# Patient Record
Sex: Male | Born: 1965 | Race: White | Hispanic: No | Marital: Married | Smoking: Current every day smoker
Health system: Southern US, Community
[De-identification: ages and names within clinical notes are randomized; demographics above are authoritative.]

## PROBLEM LIST (undated history)

## (undated) DIAGNOSIS — E669 Obesity, unspecified: Secondary | ICD-10-CM

## (undated) DIAGNOSIS — M549 Dorsalgia, unspecified: Secondary | ICD-10-CM

## (undated) DIAGNOSIS — R7301 Impaired fasting glucose: Secondary | ICD-10-CM

## (undated) DIAGNOSIS — F329 Major depressive disorder, single episode, unspecified: Secondary | ICD-10-CM

## (undated) DIAGNOSIS — E785 Hyperlipidemia, unspecified: Secondary | ICD-10-CM

## (undated) DIAGNOSIS — I10 Essential (primary) hypertension: Secondary | ICD-10-CM

## (undated) DIAGNOSIS — G8929 Other chronic pain: Secondary | ICD-10-CM

## (undated) DIAGNOSIS — F32A Depression, unspecified: Secondary | ICD-10-CM

## (undated) DIAGNOSIS — F17200 Nicotine dependence, unspecified, uncomplicated: Secondary | ICD-10-CM

## (undated) DIAGNOSIS — K219 Gastro-esophageal reflux disease without esophagitis: Secondary | ICD-10-CM

## (undated) HISTORY — DX: Impaired fasting glucose: R73.01

## (undated) HISTORY — DX: Depression, unspecified: F32.A

## (undated) HISTORY — DX: Essential (primary) hypertension: I10

## (undated) HISTORY — DX: Obesity, unspecified: E66.9

## (undated) HISTORY — DX: Hyperlipidemia, unspecified: E78.5

## (undated) HISTORY — DX: Nicotine dependence, unspecified, uncomplicated: F17.200

## (undated) HISTORY — DX: Major depressive disorder, single episode, unspecified: F32.9

## (undated) HISTORY — DX: Gastro-esophageal reflux disease without esophagitis: K21.9

## (undated) HISTORY — DX: Dorsalgia, unspecified: M54.9

## (undated) HISTORY — DX: Other chronic pain: G89.29

---

## 2008-09-30 ENCOUNTER — Ambulatory Visit: Payer: Self-pay | Admitting: Family Medicine

## 2008-09-30 DIAGNOSIS — F329 Major depressive disorder, single episode, unspecified: Secondary | ICD-10-CM

## 2008-10-02 ENCOUNTER — Telehealth: Payer: Self-pay | Admitting: Family Medicine

## 2008-11-07 ENCOUNTER — Encounter: Admission: RE | Admit: 2008-11-07 | Discharge: 2008-11-07 | Payer: Self-pay | Admitting: Family Medicine

## 2008-11-07 ENCOUNTER — Ambulatory Visit: Payer: Self-pay | Admitting: Family Medicine

## 2008-11-07 DIAGNOSIS — M545 Low back pain: Secondary | ICD-10-CM

## 2008-11-07 LAB — CONVERTED CEMR LAB
Blood in Urine, dipstick: NEGATIVE
Glucose, Urine, Semiquant: NEGATIVE
Ketones, urine, test strip: NEGATIVE
WBC Urine, dipstick: NEGATIVE

## 2008-11-11 ENCOUNTER — Encounter: Payer: Self-pay | Admitting: Family Medicine

## 2008-11-11 LAB — CONVERTED CEMR LAB
BUN: 14 mg/dL (ref 6–23)
Basophils Relative: 0 % (ref 0–1)
CO2: 23 meq/L (ref 19–32)
Calcium: 9.8 mg/dL (ref 8.4–10.5)
Chloride: 101 meq/L (ref 96–112)
Cholesterol: 306 mg/dL — ABNORMAL HIGH (ref 0–200)
Creatinine, Ser: 1.08 mg/dL (ref 0.40–1.50)
Eosinophils Absolute: 0.1 10*3/uL (ref 0.0–0.7)
Eosinophils Relative: 1 % (ref 0–5)
Glucose, Bld: 113 mg/dL — ABNORMAL HIGH (ref 70–99)
HCT: 48.1 % (ref 39.0–52.0)
HDL: 40 mg/dL (ref >39)
Lymphs Abs: 1.7 10*3/uL (ref 0.7–4.0)
MCHC: 32 g/dL (ref 30.0–36.0)
MCV: 85.1 fL (ref 78.0–100.0)
Monocytes Relative: 7 % (ref 3–12)
Neutrophils Relative %: 75 % (ref 43–77)
RBC: 5.65 M/uL (ref 4.22–5.81)
Total Bilirubin: 0.4 mg/dL (ref 0.3–1.2)
Total CHOL/HDL Ratio: 7.7
Triglycerides: 454 mg/dL — ABNORMAL HIGH (ref <150)
WBC: 10.8 10*3/uL — ABNORMAL HIGH (ref 4.0–10.5)

## 2008-11-15 ENCOUNTER — Telehealth: Payer: Self-pay | Admitting: Family Medicine

## 2009-02-18 ENCOUNTER — Ambulatory Visit: Payer: Self-pay | Admitting: Family Medicine

## 2009-02-18 DIAGNOSIS — F17200 Nicotine dependence, unspecified, uncomplicated: Secondary | ICD-10-CM

## 2009-02-18 DIAGNOSIS — I10 Essential (primary) hypertension: Secondary | ICD-10-CM

## 2009-02-18 DIAGNOSIS — E785 Hyperlipidemia, unspecified: Secondary | ICD-10-CM

## 2009-02-18 DIAGNOSIS — R7301 Impaired fasting glucose: Secondary | ICD-10-CM

## 2009-02-18 DIAGNOSIS — E669 Obesity, unspecified: Secondary | ICD-10-CM

## 2009-02-25 ENCOUNTER — Telehealth: Payer: Self-pay | Admitting: Family Medicine

## 2009-04-16 ENCOUNTER — Ambulatory Visit: Payer: Self-pay | Admitting: Family Medicine

## 2009-04-16 LAB — CONVERTED CEMR LAB: Blood Glucose, AC Bkfst: 104 mg/dL

## 2009-04-17 ENCOUNTER — Encounter: Payer: Self-pay | Admitting: Family Medicine

## 2009-04-17 LAB — CONVERTED CEMR LAB
HDL: 46 mg/dL (ref >39)
Total CHOL/HDL Ratio: 5.3
Triglycerides: 457 mg/dL — ABNORMAL HIGH (ref <150)

## 2009-05-30 ENCOUNTER — Ambulatory Visit: Payer: Self-pay | Admitting: Family Medicine

## 2009-05-30 DIAGNOSIS — R748 Abnormal levels of other serum enzymes: Secondary | ICD-10-CM | POA: Insufficient documentation

## 2009-05-30 LAB — CONVERTED CEMR LAB: Blood Glucose, AC Bkfst: 104 mg/dL

## 2009-06-06 LAB — CONVERTED CEMR LAB
CO2: 23 meq/L (ref 19–32)
Chloride: 101 meq/L (ref 96–112)
Creatinine, Ser: 0.85 mg/dL (ref 0.40–1.50)
Direct LDL: 152 mg/dL — ABNORMAL HIGH
Glucose, Bld: 101 mg/dL — ABNORMAL HIGH (ref 70–99)
Hep B Core Total Ab: NEGATIVE

## 2009-09-19 ENCOUNTER — Ambulatory Visit: Payer: Self-pay | Admitting: Family Medicine

## 2009-10-03 ENCOUNTER — Ambulatory Visit: Payer: Self-pay | Admitting: Family Medicine

## 2009-10-03 LAB — CONVERTED CEMR LAB: Hgb A1c MFr Bld: 6.1 %

## 2009-12-05 ENCOUNTER — Ambulatory Visit: Payer: Self-pay | Admitting: Family Medicine

## 2009-12-08 LAB — CONVERTED CEMR LAB
ALT: 50 units/L (ref 0–53)
AST: 30 units/L (ref 0–37)
Calcium: 9.6 mg/dL (ref 8.4–10.5)
Chloride: 97 meq/L (ref 96–112)
Creatinine, Ser: 0.93 mg/dL (ref 0.40–1.50)
Sodium: 135 meq/L (ref 135–145)
Total Bilirubin: 0.4 mg/dL (ref 0.3–1.2)

## 2010-02-18 ENCOUNTER — Emergency Department (HOSPITAL_COMMUNITY): Admission: EM | Admit: 2010-02-18 | Discharge: 2010-02-18 | Payer: Self-pay | Admitting: Emergency Medicine

## 2010-02-25 ENCOUNTER — Ambulatory Visit: Payer: Self-pay | Admitting: Family

## 2010-02-25 DIAGNOSIS — R059 Cough, unspecified: Secondary | ICD-10-CM | POA: Insufficient documentation

## 2010-02-25 DIAGNOSIS — R05 Cough: Secondary | ICD-10-CM | POA: Insufficient documentation

## 2010-03-06 ENCOUNTER — Ambulatory Visit: Payer: Self-pay | Admitting: Family Medicine

## 2010-03-18 ENCOUNTER — Telehealth: Payer: Self-pay | Admitting: Pulmonary Disease

## 2010-04-22 ENCOUNTER — Emergency Department (HOSPITAL_BASED_OUTPATIENT_CLINIC_OR_DEPARTMENT_OTHER): Admission: EM | Admit: 2010-04-22 | Discharge: 2010-04-22 | Payer: Self-pay | Admitting: Emergency Medicine

## 2010-04-28 ENCOUNTER — Emergency Department (HOSPITAL_BASED_OUTPATIENT_CLINIC_OR_DEPARTMENT_OTHER): Admission: EM | Admit: 2010-04-28 | Discharge: 2010-04-28 | Payer: Self-pay | Admitting: Emergency Medicine

## 2010-05-08 ENCOUNTER — Ambulatory Visit: Payer: Self-pay | Admitting: Family Medicine

## 2010-05-08 DIAGNOSIS — J301 Allergic rhinitis due to pollen: Secondary | ICD-10-CM

## 2010-07-28 ENCOUNTER — Emergency Department (HOSPITAL_BASED_OUTPATIENT_CLINIC_OR_DEPARTMENT_OTHER)
Admission: EM | Admit: 2010-07-28 | Discharge: 2010-07-28 | Payer: Self-pay | Source: Home / Self Care | Admitting: Emergency Medicine

## 2010-07-31 ENCOUNTER — Ambulatory Visit: Payer: Self-pay | Admitting: Family Medicine

## 2010-07-31 DIAGNOSIS — J209 Acute bronchitis, unspecified: Secondary | ICD-10-CM | POA: Insufficient documentation

## 2010-09-13 ENCOUNTER — Encounter: Payer: Self-pay | Admitting: Family Medicine

## 2010-09-18 ENCOUNTER — Ambulatory Visit
Admission: RE | Admit: 2010-09-18 | Discharge: 2010-09-18 | Payer: Self-pay | Source: Home / Self Care | Attending: Family Medicine | Admitting: Family Medicine

## 2010-09-22 NOTE — Assessment & Plan Note (Signed)
Summary: back pain/ IFG/ HTN   Vital Signs:  Patient profile:   45 year old male Height:      63.5 inches Weight:      230 pounds BMI:     40.25 O2 Sat:      97 % on Room air Temp:     97.7 degrees F oral Pulse rate:   107 / minute BP sitting:   152 / 96  (right arm) Cuff size:   large  Vitals Entered By: [name] CMA/April (October 03, 2009 8:27 AM)  O2 Flow:  Room air CC: 4 month f/u and meds refill   Primary Care Provider:  Seymour Bars DO  CC:  4 month f/u and meds refill.  History of Present Illness: 45 yo WM presents for f/u IFG, HTN and back pain.  His back pain did improve after treatment but started again.  He turned the wrong way and his pain is in the upper lumbar region on both sides.  He is having trouble getting comfortable at night.  He has not been doing any stretching and he has yet to make any changes to his diet or exercise.  He is doing well on his meds.  Trying to be more compliant.  Denies CP or DOE.    Current Medications (verified): 1)  Fluoxetine Hcl 20 Mg Caps (Fluoxetine Hcl) .Marland Kitchen.. 1 Capsule By Mouth Daily 2)  Pravastatin Sodium 80 Mg Tabs (Pravastatin Sodium) .Marland Kitchen.. 1 Tab By Mouth Qhs 3)  Enalapril Maleate 10 Mg Tabs (Enalapril Maleate) .Marland Kitchen.. 1 Tab By Mouth Daily 4)  Metformin Hcl 500 Mg Tabs (Metformin Hcl) .Marland Kitchen.. 1 Tab By Mouth Bid 5)  Flexeril 10 Mg Tabs (Cyclobenzaprine Hcl) .Marland Kitchen.. 1 Tab By Mouth At Bedtime As Needed Back Pain 6)  Vicodin 5-500 Mg Tabs (Hydrocodone-Acetaminophen) .Marland Kitchen.. 1 Tab By Mouth Three Times A Day As Needed Severe Back Pain  Allergies (verified): No Known Drug Allergies  Past History:  Past Medical History: Reviewed history from 09/19/2009 and no changes required. Depression/ anxiety IFG/ borderline T2DM Hyperlipidemia Class III obesity HTN Smoker  Social History: Reviewed history from 09/30/2008 and no changes required. Unemployed.  Used to work at [employer]. Finished HS. Lives with fiance, Corbin Ade.  Has a grown son and 30 yo daughter, shares custody. Smokes 1/2 ppd x 17 yrs. Drinks >10/ wk. No exercise.  Poor diet.    Review of Systems      See HPI  Physical Exam  General:  obese WM in NAD Head:  normocephalic and atraumatic.   Eyes:  pupils equal, pupils round, and pupils reactive to light.   Nose:  no nasal discharge.   Mouth:  pharynx pink and moist.   Neck:  no masses.   Lungs:  Normal respiratory effort, chest expands symmetrically. Lungs are clear to auscultation, no crackles or wheezes. Heart:  Normal rate and regular rhythm. S1 and S2 normal without gallop, murmur, click, rub or other extra sounds. Msk:  tender over L2-L4 midline with tender L spine flexion to 75 deg, full extension.  Normal gait, neg straight leg raise Extremities:  no LE edema Neurologic:  gait normal and DTRs symmetrical and normal.   Skin:  color normal.   Cervical Nodes:  No lymphadenopathy noted Psych:  good eye contact, not anxious appearing, and not depressed appearing.     Impression & Recommendations:  Problem # 1:  LUMBAGO (ICD-724.2) Recurrent MSK low back strain. Treat with RX Flexeril, Vicodin  for short term use and OTC Aleve.  Use heat, gentle stretching and recommend starting chiropractic manipulation. Ulimately, he needs to lose wt and start improving core strength. The following medications were removed from the medication list:    Vicodin 5-500 Mg Tabs (Hydrocodone-acetaminophen) .Marland Kitchen... 1 tab by mouth three times a day as needed severe back pain His updated medication list for this problem includes:    Flexeril 10 Mg Tabs (Cyclobenzaprine hcl) .Marland Kitchen... 1 tab by mouth at bedtime as needed back pain    Vicodin 5-500 Mg Tabs (Hydrocodone-acetaminophen) .Marland Kitchen... 1-2 tabs by mouth three times a day as needed severe back pain, take with food  Problem # 2:  ESSENTIAL HYPERTENSION, BENIGN (ICD-401.1) BP still high.  Changed Enalparil to Enalapril with HCTZ.  F/U in 8 wks to see if BP  improves. His updated medication list for this problem includes:    Enalapril-hydrochlorothiazide 10-25 Mg Tabs (Enalapril-hydrochlorothiazide) .Marland Kitchen... 1 tab by mouth daily  BP today: 152/96 Prior BP: 147/100 (09/19/2009)  Labs Reviewed: K+: 4.5 (05/30/2009) Creat: : 0.85 (05/30/2009)   Chol: 245 (04/17/2009)   HDL: 46 (04/17/2009)   LDL: See Comment mg/dL (16/05/9603)   TG: 540 (04/17/2009)  Problem # 3:  IMPAIRED FASTING GLUCOSE (ICD-790.21) A1C 6.1 down from 6.6 with Metformin use.  Still needs to work on diet, exericise, wt loss. Repeat in 6 mos. His updated medication list for this problem includes:    Metformin Hcl 500 Mg Tabs (Metformin hcl) .Marland Kitchen... 1 tab by mouth bid  Orders: Fingerstick (36416) Hemoglobin A1C (83036)  Problem # 4:  HYPERLIPIDEMIA (ICD-272.4)  His updated medication list for this problem includes:    Pravastatin Sodium 80 Mg Tabs (Pravastatin sodium) .Marland Kitchen... 1 tab by mouth qhs  Labs Reviewed: SGOT: 33 (05/30/2009)   SGPT: 54 (05/30/2009)   HDL:46 (04/17/2009), 40 (11/07/2008)  LDL:See Comment mg/dL (98/06/9146), See Comment mg/dL (82/95/6213)  YQMV:784 (04/17/2009), 306 (11/07/2008)  Trig:457 (04/17/2009), 454 (11/07/2008)  Problem # 5:  DEPRESSION, RECURRENT (ICD-311) Improved.  RFd fluoxetine. His updated medication list for this problem includes:    Fluoxetine Hcl 20 Mg Caps (Fluoxetine hcl) .Marland Kitchen... 1 capsule by mouth daily  Complete Medication List: 1)  Fluoxetine Hcl 20 Mg Caps (Fluoxetine hcl) .Marland Kitchen.. 1 capsule by mouth daily 2)  Pravastatin Sodium 80 Mg Tabs (Pravastatin sodium) .Marland Kitchen.. 1 tab by mouth qhs 3)  Enalapril-hydrochlorothiazide 10-25 Mg Tabs (Enalapril-hydrochlorothiazide) .Marland Kitchen.. 1 tab by mouth daily 4)  Metformin Hcl 500 Mg Tabs (Metformin hcl) .Marland Kitchen.. 1 tab by mouth bid 5)  Flexeril 10 Mg Tabs (Cyclobenzaprine hcl) .Marland Kitchen.. 1 tab by mouth at bedtime as needed back pain 6)  Vicodin 5-500 Mg Tabs (Hydrocodone-acetaminophen) .Marland Kitchen.. 1-2 tabs by mouth three  times a day as needed severe back pain, take with food  Patient Instructions: 1)  For back pain, take Aleve 2 x daily with food. 2)  Stay on Prilosec OTC to help protect stomach. 3)  Use Flexeril at night (muscle relaxer). 4)  Use Vicodin for severe pain. 5)  No heavy lifting x 2 wks. 6)  Call chiropractor to get in for manipulation treatment. 7)  BP med changed to Enalapril/HCTZ. 8)  Keep working on [employer], exercise, wt loss. 9)  Meds RFd. 10)  Return for f/u back pain/ weight in 6-8 wks. Prescriptions: FLUOXETINE HCL 20 MG CAPS (FLUOXETINE HCL) 1 capsule by mouth daily  #30 x 3   Entered and Authorized by:   Seymour Bars DO   Signed by:  Seymour Bars DO on 10/03/2009   Method used:   Electronically to        [facility] [phone]* (retail)       [address] [place], [place]  [address]       Ph: [phone]       Fax: [phone]   RxID:   [id number] ENALAPRIL-HYDROCHLOROTHIAZIDE 10-25 MG TABS (ENALAPRIL-HYDROCHLOROTHIAZIDE) 1 tab by mouth daily  #30 x 5   Entered and Authorized by:   Seymour Bars DO   Signed by:   Seymour Bars DO on 10/03/2009   Method used:   Electronically to        [facility] [phone]* (retail)       [address] [place], [place]  [address]       Ph: [phone]       Fax: [phone]   RxID:   [id number] VICODIN 5-500 MG TABS (HYDROCODONE-ACETAMINOPHEN) 1-2 tabs by mouth three times a day as needed severe back pain, take with food  #40 x 0   Entered and Authorized by:   Seymour Bars DO   Signed by:   Seymour Bars DO on 10/03/2009   Method used:   Printed then faxed to ...       [address] [phone]* (retail)       [address] [place], [place]  [address]       Ph: [phone]       Fax: [phone]   RxID:   [id number] FLEXERIL 10 MG TABS (CYCLOBENZAPRINE HCL) 1 tab by mouth at bedtime as needed back pain  #24 x 0   Entered and Authorized by:   Seymour Bars DO   Signed by:   Seymour Bars DO on 10/03/2009    Method used:   Electronically to        [facility] [phone]* (retail)       [address] [place], [place]  [address]       Ph: [phone]       Fax: [phone]   RxID:   [id number]   Laboratory Results   Blood Tests     HGBA1C: 6.1%   (Normal Range: Non-Diabetic - 3-6%   Control Diabetic - 6-8%)

## 2010-09-22 NOTE — Assessment & Plan Note (Signed)
Summary: cough/ back pain   Vital Signs:  Patient profile:   45 year old male Height:      63.5 inches Weight:      233 pounds BMI:     40.77 O2 Sat:      97 % on Room air Temp:     98.0 degrees F oral Pulse rate:   94 / minute BP sitting:   147 / 100  (left arm) Cuff size:   large  Vitals Entered By: [name] CMA (September 19, 2009 1:28 PM)  O2 Flow:  Room air CC: Dry cough x 1 month. Worse at night.    Primary Care Provider:  Seymour Bars DO  CC:  Dry cough x 1 month. Worse at night. .  History of Present Illness: 45 yo WM presents for dry cough x 2 months.  No sputum.  No chest tightness.  Denies feeling SOB but has some DOE.  He is cutting back on smoking.  He has not taken his BP medicine today.  The cough did not start with onset of ACEi use.  Cough worse at night.   He has 'tickle' in his throat.  C/O acid reflux but had stopped PPI s a few ws aog.    He has had some LBP x 2 days after jumping off the stairs.  he is taking Naprosyn but it is not helping.  Pain is localized to the R side.  Denies radiation into the buttock or down the legs.  Denies paresthesias or weakness.  Feels better at rest.    Current Medications (verified): 1)  Fluoxetine Hcl 20 Mg Caps (Fluoxetine Hcl) .Marland Kitchen.. 1 Capsule By Mouth Daily 2)  Pravastatin Sodium 80 Mg Tabs (Pravastatin Sodium) .Marland Kitchen.. 1 Tab By Mouth Qhs 3)  Enalapril Maleate 10 Mg Tabs (Enalapril Maleate) .Marland Kitchen.. 1 Tab By Mouth Daily 4)  Metformin Hcl 500 Mg Tabs (Metformin Hcl) .Marland Kitchen.. 1 Tab By Mouth Bid  Allergies (verified): No Known Drug Allergies  Past History:  Past Medical History: Depression/ anxiety IFG/ borderline T2DM Hyperlipidemia Class III obesity HTN Smoker  Social History: Reviewed history from 09/30/2008 and no changes required. Unemployed.  Used to work at [employer]. Finished HS. Lives with fiance, Corbin Ade.  Has a grown son and 57 yo daughter, shares custody. Smokes 1/2 ppd x 17 yrs. Drinks >10/  wk. No exercise.  Poor diet.    Review of Systems      See HPI  Physical Exam  General:  obese WM in NAD Mouth:  pharynx pink and moist.   Neck:  no masses.   Lungs:  Normal respiratory effort, chest expands symmetrically. Lungs are clear to auscultation, no crackles or wheezes. Heart:  Normal rate and regular rhythm. S1 and S2 normal without gallop, murmur, click, rub or other extra sounds. Abdomen:  truncal obesity no epigastric TTP   Msk:  R lumbar muscle spasm with tenderness.  full active L spine flexion (w/ some pain).  full extension w/o pain.  normal gait.  tender with SB to the L side.   Neurologic:  strength normal in all extremities, gait normal, and DTRs symmetrical and normal.   Skin:  color normal.     Impression & Recommendations:  Problem # 1:  COUGH (ICD-786.2) 2 mos of cough, worse at night with a normal lung exam and pulse ox.  Smoker with symptoms c/w acid reflux. DDX includes reflux esophagitis, COPD, postnasal drip, ACEi cough.    I will  start him on Protonix samples once daily and he has f/u with me in 2 wks. If cough is not improving, will get a CXR and Peak Flows.    Problem # 2:  LUMBAGO (ICD-724.2) R lumbar back strain after jumping injury. Treat with Flexeril at night, OTC Ibuprofen with meals and Vicodin for severe pain for short term use. OK to use icy hot, heat for comfort.  Avoid strenous activity.  His updated medication list for this problem includes:    Flexeril 10 Mg Tabs (Cyclobenzaprine hcl) .Marland Kitchen... 1 tab by mouth at bedtime as needed back pain    Vicodin 5-500 Mg Tabs (Hydrocodone-acetaminophen) .Marland Kitchen... 1 tab by mouth three times a day as needed severe back pain  Problem # 3:  ESSENTIAL HYPERTENSION, BENIGN (ICD-401.1) BP high today but was normal last visit.  He needs to take his medicine everyday.  I did remind him about this. His updated medication list for this problem includes:    Enalapril Maleate 10 Mg Tabs (Enalapril maleate) .Marland Kitchen... 1  tab by mouth daily  BP today: 147/100 Prior BP: 105/74 (05/30/2009)  Labs Reviewed: K+: 4.5 (05/30/2009) Creat: : 0.85 (05/30/2009)   Chol: 245 (04/17/2009)   HDL: 46 (04/17/2009)   LDL: See Comment mg/dL (44/08/270)   TG: 536 (04/17/2009)  Complete Medication List: 1)  Fluoxetine Hcl 20 Mg Caps (Fluoxetine hcl) .Marland Kitchen.. 1 capsule by mouth daily 2)  Pravastatin Sodium 80 Mg Tabs (Pravastatin sodium) .Marland Kitchen.. 1 tab by mouth qhs 3)  Enalapril Maleate 10 Mg Tabs (Enalapril maleate) .Marland Kitchen.. 1 tab by mouth daily 4)  Metformin Hcl 500 Mg Tabs (Metformin hcl) .Marland Kitchen.. 1 tab by mouth bid 5)  Flexeril 10 Mg Tabs (Cyclobenzaprine hcl) .Marland Kitchen.. 1 tab by mouth at bedtime as needed back pain 6)  Vicodin 5-500 Mg Tabs (Hydrocodone-acetaminophen) .Marland Kitchen.. 1 tab by mouth three times a day as needed severe back pain  Patient Instructions: 1)  Start Protonix samples - 1 tablet by mouth daily, take 30 min before dinner. 2)  Use Flexeril at night as your muscle relaxer. 3)  Use Vicodin for severe pain. 4)  Use OTC Ibuprofen 4 tabs (800 mg) 3 x a day with food. 5)  Use warm moist heat and gentle stretches. 6)  F/U in 2 wks. Prescriptions: VICODIN 5-500 MG TABS (HYDROCODONE-ACETAMINOPHEN) 1 tab by mouth three times a day as needed severe back pain  #24 x 0   Entered and Authorized by:   Seymour Bars DO   Signed by:   Seymour Bars DO on 09/19/2009   Method used:   Printed then faxed to ...       [address] [phone]* (retail)       [address] [place], [place]  [address]       Ph: [phone]       Fax: [phone]   RxID:   [id number] FLEXERIL 10 MG TABS (CYCLOBENZAPRINE HCL) 1 tab by mouth at bedtime as needed back pain  #20 x 0   Entered and Authorized by:   Seymour Bars DO   Signed by:   Seymour Bars DO on 09/19/2009   Method used:   Electronically to        [facility] [phone]* (retail)       [address] [place], [place]  [address]       Ph: [phone]       Fax:  [phone]   RxID:    [id number]

## 2010-09-22 NOTE — Assessment & Plan Note (Signed)
Summary: f/u HTN/ ACEi cough   Vital Signs:  Patient profile:   45 year old male Height:      63.5 inches Weight:      224 pounds BMI:     39.20 O2 Sat:      94 % on Room air Pulse rate:   106 / minute BP sitting:   147 / 92  (left arm) Cuff size:   large  Vitals Entered By: [name] CMA (March 06, 2010 3:13 PM)  O2 Flow:  Room air CC: 3 mo f/u BP, mood and A1C. Stopped all BP meds due to cough.   Primary Care Provider:  Seymour Bars DO  CC:  3 mo f/u BP and mood and A1C. Stopped all BP meds due to cough..  History of Present Illness: 45 yo WM presents for a dry hacking  cough that started from his BP meds so he stopped all of them and his cough is better.  He started seeing a chiropractor for his back pain which has been chronic.  He plans to gain health insurance very soon.  Lambert;s cough has much improved off his ACEi.  He denies any hemoptyis, chest tightness or SOB.  He is trying to cut back on his smoking.  Mood has improved and he is doing well on Fluoxetine.  Current Medications (verified): 1)  Fluoxetine Hcl 20 Mg Caps (Fluoxetine Hcl) .Marland Kitchen.. 1 Capsule By Mouth Daily 2)  Pravastatin Sodium 80 Mg Tabs (Pravastatin Sodium) .Marland Kitchen.. 1 Tab By Mouth Qhs  Allergies (verified): No Known Drug Allergies  Past History:  Past Medical History: Depression/ anxiety IFG/ borderline T2DM Hyperlipidemia Class III obesity HTN Smoker chronic back pain  Social History: Reviewed history from 09/30/2008 and no changes required. Unemployed.  Used to work at [employer]. Finished HS. Lives with fiance, Corbin Ade.  Has a grown son and 40 yo daughter, shares custody. Smokes 1/2 ppd x 17 yrs. Drinks >10/ wk. No exercise.  Poor diet.    Review of Systems      See HPI  Physical Exam  General:  alert, well-developed, well-nourished, and well-hydrated.  obese Head:  normocephalic and atraumatic.   Mouth:  pharynx pink and moist.   Neck:  no masses.   Lungs:  Normal  respiratory effort, chest expands symmetrically. Lungs are clear to auscultation, no crackles or wheezes. Heart:  Normal rate and regular rhythm. S1 and S2 normal without gallop, murmur, click, rub or other extra sounds. Extremities:  no LE edema Skin:  color normal.   Psych:  good eye contact, not anxious appearing, and not depressed appearing.     Impression & Recommendations:  Problem # 1:  ESSENTIAL HYPERTENSION, BENIGN (ICD-401.1) Stopped ACEi due to cough which has already improved.  Will treat his high CP with HCTZ 25 mg/ day.  RTC to recheck BP and BMP in the next 2-3 mos. The following medications were removed from the medication list:    Chlorthalidone 25 Mg Tabs (Chlorthalidone) .Marland Kitchen... 1 tab by mouth qam    Metoprolol Tartrate 25 Mg Tabs (Metoprolol tartrate) ..... One tablet by mouth daily His updated medication list for this problem includes:    Hydrochlorothiazide 25 Mg Tabs (Hydrochlorothiazide) .Marland Kitchen... 1 tab by mouth qam  Problem # 2:  LUMBAGO (ICD-724.2) Will use Flexeril + Vicodin only for flare ups of moderate to severe back pain, uisng Advil for less severe pain as needed.  Continue chiropractic treatment and will need to get him working  on wt loss and regular exercise. The following medications were removed from the medication list:    Mobic 7.5 Mg Tabs (Meloxicam) ..... One tab by mouth daily as needed for pain His updated medication list for this problem includes:    Vicodin 5-500 Mg Tabs (Hydrocodone-acetaminophen) .Marland Kitchen... 1-2 tabs by mouth two times a day as needed severe back pain    Cyclobenzaprine Hcl 10 Mg Tabs (Cyclobenzaprine hcl) .Marland Kitchen... 1 tab by mouth at bedtime as needed back pain  Problem # 3:  DEPRESSION, RECURRENT (ICD-311) PHQ 9 today proves clinical remission.  Doing well on Fluoxetine, continue. His updated medication list for this problem includes:    Fluoxetine Hcl 20 Mg Caps (Fluoxetine hcl) .Marland Kitchen... 1 capsule by mouth daily  Complete Medication  List: 1)  Fluoxetine Hcl 20 Mg Caps (Fluoxetine hcl) .Marland Kitchen.. 1 capsule by mouth daily 2)  Pravastatin Sodium 80 Mg Tabs (Pravastatin sodium) .Marland Kitchen.. 1 tab by mouth qhs 3)  Vicodin 5-500 Mg Tabs (Hydrocodone-acetaminophen) .Marland Kitchen.. 1-2 tabs by mouth two times a day as needed severe back pain 4)  Hydrochlorothiazide 25 Mg Tabs (Hydrochlorothiazide) .Marland Kitchen.. 1 tab by mouth qam 5)  Cyclobenzaprine Hcl 10 Mg Tabs (Cyclobenzaprine hcl) .Marland Kitchen.. 1 tab by mouth at bedtime as needed back pain  Other Orders: Hgb A1C ([id number])  Patient Instructions: 1)  Keep working on diabetic diet, exercise, wt loss. 2)  REsume meds for back pain - Cyclobenzaprine is your muscle relaxer for night only and Vicodin is for severe pain.  Use Advil for less severe pain. 3)  For BP, take HCTZ 25mg  every AM. 4)  Return for f/u with fasting labs in 2 mos. Prescriptions: PRAVASTATIN SODIUM 80 MG TABS (PRAVASTATIN SODIUM) 1 tab by mouth qhs  #90 x 2   Entered and Authorized by:   Seymour Bars DO   Signed by:   Seymour Bars DO on 03/06/2010   Method used:   Electronically to        [facility] [phone]* (retail)       [address] [place], [place]  [address]       Ph: [phone]       Fax: [phone]   RxID:   [id number] FLUOXETINE HCL 20 MG CAPS (FLUOXETINE HCL) 1 capsule by mouth daily  #90 x 1   Entered and Authorized by:   Seymour Bars DO   Signed by:   Seymour Bars DO on 03/06/2010   Method used:   Electronically to        [facility] [phone]* (retail)       [address] [place], [place]  [address]       Ph: [phone]       Fax: [phone]   RxID:   [id number] CYCLOBENZAPRINE HCL 10 MG TABS (CYCLOBENZAPRINE HCL) 1 tab by mouth at bedtime as needed back pain  #30 x 1   Entered and Authorized by:   Seymour Bars DO   Signed by:   Seymour Bars DO on 03/06/2010   Method used:   Electronically to        [facility] [phone]* (retail)       [address] [place], [place]  [address]       Ph:  [phone]       Fax: [phone]   RxID:   [id number] HYDROCHLOROTHIAZIDE 25 MG TABS (HYDROCHLOROTHIAZIDE)  1 tab by mouth qAM  #30 x 3   Entered and Authorized by:   Seymour Bars DO   Signed by:   Seymour Bars DO on 03/06/2010   Method used:   Electronically to        [facility] [phone]* (retail)       [address] [place], [place]  [address]       Ph: [phone]       Fax: [phone]   RxID:   [id number] VICODIN 5-500 MG TABS (HYDROCODONE-ACETAMINOPHEN) 1-2 tabs by mouth two times a day as needed severe back pain  #40 x 0   Entered and Authorized by:   Seymour Bars DO   Signed by:   Seymour Bars DO on 03/06/2010   Method used:   Printed then faxed to ...       [address] [phone]* (retail)       [address] [place], [place]  [address]       Ph: [phone]       Fax: [phone]   RxID:   [id number]   Laboratory Results   Blood Tests     HGBA1C: 6.1%   (Normal Range: Non-Diabetic - 3-6%   Control Diabetic - 6-8%)

## 2010-09-22 NOTE — Progress Notes (Signed)
Summary: nos appt  Phone Note Call from Patient   Caller: [email or web address]  Call For: [facility] [place] Summary of Call: ATC pt to [facility] nos from 7/26 phone disconnected. Initial call taken by: Darletta Moll,  March 18, 2010 2:55 PM

## 2010-09-22 NOTE — Assessment & Plan Note (Signed)
Summary: ED f/u for bronchitis   Vital Signs:  Patient profile:   45 year old male Height:      63.5 inches Weight:      213 pounds BMI:     37.27 O2 Sat:      97 % on Room air Temp:     97.7 degrees F oral Pulse rate:   94 / minute BP sitting:   143 / 76  (left arm) Cuff size:   large  Vitals Entered By: [name] CMA (July 31, 2010 2:00 PM)  O2 Flow:  Room air CC: F/U ED for bronchitis 07/28/10. Started on Azithromycin 250mg  x 6 days but feels no better.    Primary Care Provider:  Seymour Bars DO  CC:  F/U ED for bronchitis 07/28/10. Started on Azithromycin 250mg  x 6 days but feels no better. Gerald West  History of Present Illness: 45 yo WM presents for f/u bronchitis.  He was seen at [facility] ED on 07-28-10.  His symptoms started on 12-1.  He had F/C/ GI uset and cough.  His F/C has improved.  His cough is improving.  He quit smoking 4 days ago.  He is on day 4/5 but is still sweating and has some chest tightness.    His CXR in the ED was negative.      Current Medications (verified): 1)  Fluoxetine Hcl 20 Mg Caps (Fluoxetine Hcl) .Gerald West.. 1 Capsule By Mouth Daily 2)  Percocet 5-325 Mg Tabs (Oxycodone-Acetaminophen) .Gerald West.. 1 Tab By Mouth Three Times A Day As Needed Severe Back Pain 3)  Mucinex Dm 30-600 Mg Xr12h-Tab (Dextromethorphan-Guaifenesin) .Gerald West.. 1 Tab By Mouth Q 12 Hrs As Needed Cough/ Congestion 4)  Omnaris 50 Mcg/act Susp (Ciclesonide) .... 2 Sprays/ Nostril Daily  Allergies (verified): No Known Drug Allergies  Past History:  Past Medical History: Reviewed history from 03/06/2010 and no changes required. Depression/ anxiety IFG/ borderline T2DM Hyperlipidemia Class III obesity HTN Smoker chronic back pain  Social History: Reviewed history from 09/30/2008 and no changes required. Unemployed.  Used to work at [employer]. Finished HS. Lives with fiance, Gerald West.  Has a grown son and 75 yo daughter, shares custody. Smokes 1/2 ppd x 17 yrs. Drinks >10/  wk. No exercise.  Poor diet.    Review of Systems      See HPI  Physical Exam  General:  alert, well-developed, well-nourished, and well-hydrated.  obese Head:  normocephalic and atraumatic.   Eyes:  conjunctiva clear Nose:  nasal congestion present Mouth:  o/p injected Neck:  no masses.   Chest Wall:  no tenderness.   Lungs:  Normal respiratory effort, chest expands symmetrically. Lungs are clear to auscultation, no crackles or wheezes.  dry hacking cough Heart:  Normal rate and regular rhythm. S1 and S2 normal without gallop, murmur, click, rub or other extra sounds. Abdomen:  soft and non-tender.   Extremities:  no LE edema Skin:  skin warm and diaphoretic Cervical Nodes:  No lymphadenopathy noted   Impression & Recommendations:  Problem # 1:  ACUTE BRONCHITIS (ICD-466.0)  PFs in the green zone, so no steroids or bronchodilators initiated. Given ongoing diaphoresis and chest congestion after 4 days of Zithromax, I will change him to 10 days of Doxycycline.  He can use plain Mucinex, Advil and Tussicaps 1 cap q 12 hrs for cough (samples given).  Call if not improved after 7 days.  Stay off cigarettes. His updated medication list for this problem includes:  Mucinex Dm 30-600 Mg Xr12h-tab (Dextromethorphan-guaifenesin) .Gerald West... 1 tab by mouth q 12 hrs as needed cough/ congestion    Doxycycline Hyclate 100 Mg Caps (Doxycycline hyclate) .Gerald West... 1 capsule by mouth two times a day x 10 days  Orders: Peak Flow Rate (94150)  Complete Medication List: 1)  Fluoxetine Hcl 20 Mg Caps (Fluoxetine hcl) .Gerald West.. 1 capsule by mouth daily 2)  Percocet 5-325 Mg Tabs (Oxycodone-acetaminophen) .Gerald West.. 1 tab by mouth three times a day as needed severe back pain 3)  Mucinex Dm 30-600 Mg Xr12h-tab (Dextromethorphan-guaifenesin) .Gerald West.. 1 tab by mouth q 12 hrs as needed cough/ congestion 4)  Omnaris 50 Mcg/act Susp (Ciclesonide) .... 2 sprays/ nostril daily 5)  Doxycycline Hyclate 100 Mg Caps (Doxycycline  hyclate) .Gerald West.. 1 capsule by mouth two times a day x 10 days  Patient Instructions: 1)  Change antibiotic to Doxycycline 1 tab two times a day x 10 days; take with food. 2)  Avoid smoking. 3)  Use PLAIN MUCINEX 1-2 tabs every 12 hrs for chest congestion. 4)  Use RX samples of TUSSICAPS take 1 capsule every 12 hrs as needed for cough. 5)  Hold Percocet while on Tussicaps. 6)  Call if not improved in 7-10 days. Prescriptions: DOXYCYCLINE HYCLATE 100 MG CAPS (DOXYCYCLINE HYCLATE) 1 capsule by mouth two times a day x 10 days  #20 x 0   Entered and Authorized by:   Seymour Bars DO   Signed by:   Seymour Bars DO on 07/31/2010   Method used:   Electronically to        [facility] [phone]* (retail)       [address] [place], [place]  [address]       Ph: [phone]       Fax: [phone]   RxID:   [id number] PERCOCET 5-325 MG TABS (OXYCODONE-ACETAMINOPHEN) 1 tab by mouth three times a day as needed severe back pain  #60 x 0   Entered and Authorized by:   Seymour Bars DO   Signed by:   Seymour Bars DO on 07/31/2010   Method used:   Print then Give to Patient   RxID:   [id number]    Orders Added: 1)  Est. Patient Level III [34742] 2)  Peak Flow Rate [94150]

## 2010-09-22 NOTE — Assessment & Plan Note (Signed)
Summary: f/u allergies   Vital Signs:  Patient profile:   45 year old male Height:      63.5 inches Weight:      218 pounds BMI:     38.15 O2 Sat:      96 % on Room air Temp:     98.2 degrees F oral Pulse rate:   95 / minute BP sitting:   143 / 94  (left arm) Cuff size:   large  Vitals Entered By: [name] CMA (May 08, 2010 2:55 PM)  O2 Flow:  Room air CC: Sinusitis x 1 week   Primary Care Provider:  Seymour Bars DO  CC:  Sinusitis x 1 week.  History of Present Illness: Gerald West presents for sinusitis.  He thought he had strep throat on 8-31, went to the ED.  He was given Amoxicillin and Vicodin.  He thought that he had an allergic reaction but then he restarted the Amox 3 days later and his ST did improve.  Denies fevers or chills.  Has scratchy throat, ear pressure, dry cough.  Has a lot of rhinorrhea.   Still suffering with chronic back pain.  wroking on wt loss.  Pain still mod intensity even w/ 2 vicodin.  Has appt with chiropractor next wk.   Current Medications (verified): 1)  Fluoxetine Hcl 20 Mg Caps (Fluoxetine Hcl) .Marland Kitchen.. 1 Capsule By Mouth Daily 2)  Pravastatin Sodium 80 Mg Tabs (Pravastatin Sodium) .Marland Kitchen.. 1 Tab By Mouth Qhs 3)  Vicodin 5-500 Mg Tabs (Hydrocodone-Acetaminophen) .Marland Kitchen.. 1-2 Tabs By Mouth Two Times A Day As Needed Severe Back Pain  Allergies (verified): No Known Drug Allergies  Review of Systems      See HPI  Physical Exam  General:  alert, well-developed, well-nourished, well-hydrated, and overweight-appearing.   Head:  normocephalic and atraumatic.  sinuses NTTP Eyes:  eyes watery with slight conjunctival injection Ears:  EACs patent; TMs translucent and gray with good cone of light and bony landmarks.  Nose:  clear rhinorrhea Mouth:  cobblestoning Neck:  no masses.   Lungs:  Normal respiratory effort, chest expands symmetrically. Lungs are clear to auscultation, no crackles or wheezes. Heart:  Normal rate and regular rhythm. S1  and S2 normal without gallop, murmur, click, rub or other extra sounds. Skin:  color normal.   Cervical Nodes:  No lymphadenopathy noted   Impression & Recommendations:  Problem # 1:  HAY FEVER (ICD-477.0) Flare of seasonal allergies, not improved after abx. will treat with burst of Prednisone + Omnaris nasal spray.   Mucinex DM for cough, avoid smoking.  Call if not improved in 1 wk.  Problem # 2:  LUMBAGO (ICD-724.2) Chronic.  Has appt with chiropractor and is working on weight loss.  RX for Percocet given b/c he was not having adequate pain releif with 2 Hydrocodone daily.   The following medications were removed from the medication list:    Cyclobenzaprine Hcl 10 Mg Tabs (Cyclobenzaprine hcl) .Marland Kitchen... 1 tab by mouth at bedtime as needed back pain His updated medication list for this problem includes:    Percocet 5-325 Mg Tabs (Oxycodone-acetaminophen) .Marland Kitchen... 1 tab by mouth three times a day as needed severe back pain  Complete Medication List: 1)  Fluoxetine Hcl 20 Mg Caps (Fluoxetine hcl) .Marland Kitchen.. 1 capsule by mouth daily 2)  Pravastatin Sodium 80 Mg Tabs (Pravastatin sodium) .Marland Kitchen.. 1 tab by mouth qhs 3)  Percocet 5-325 Mg Tabs (Oxycodone-acetaminophen) .Marland Kitchen.. 1 tab by mouth three times a  day as needed severe back pain 4)  Prednisone 20 Mg Tabs (Prednisone) .... 2 tabs by mouth qam x 5 days 5)  Mucinex Dm 30-600 Mg Xr12h-tab (Dextromethorphan-guaifenesin) .Marland Kitchen.. 1 tab by mouth q 12 hrs as needed cough/ congestion 6)  Omnaris 50 Mcg/act Susp (Ciclesonide) .... 2 sprays/ nostril daily  Patient Instructions: 1)  Take 5 days Prednisone (40 mg once daily). 2)  Use sample Omnaris nasal spray - 2 sprays/ nostril once daily. 3)  Take OTC Mucinex DM for cough. 4)  REst, clear fluids, avoid smoking. 5)  Change pain medicine to Percocet up to 3 x a day for severe back pain. 6)  Return for f/u in 1 month. Prescriptions: PERCOCET 5-325 MG TABS (OXYCODONE-ACETAMINOPHEN) 1 tab by mouth three times a day  as needed severe back pain  #60 x 0   Entered and Authorized by:   Seymour Bars DO   Signed by:   Seymour Bars DO on 05/08/2010   Method used:   Print then Give to Patient   RxID:   [id number] PREDNISONE 20 MG TABS (PREDNISONE) 2 tabs by mouth qAM x 5 days  #10 x 0   Entered and Authorized by:   Seymour Bars DO   Signed by:   Seymour Bars DO on 05/08/2010   Method used:   Electronically to        [facility] [phone]* (retail)       [address] [place], [place]  [address]       Ph: [phone]       Fax: [phone]   RxID:   [id number]

## 2010-09-22 NOTE — Assessment & Plan Note (Signed)
Summary: f/u DM/ back pain/ HTN   Vital Signs:  Patient profile:   45 year old male Height:      63.5 inches Weight:      231 pounds BMI:     40.42 O2 Sat:      97 % on Room air Pulse rate:   98 / minute BP sitting:   135 / 92  (left arm) Cuff size:   large  Vitals Entered By: [name] CMA (December 05, 2009 8:32 AM)  O2 Flow:  Room air CC: F/U back pain. Getting better but still having some pain.    Primary Care Provider:  Seymour Bars DO  CC:  F/U back pain. Getting better but still having some pain. Marland Kitchen  History of Present Illness: 45 yo WM presents for f/u IFG, LBP and HTN.  He reports that his mood is much improved with starting Fluoxetine and he is happy at current dose.  He is getting married next month.  His back pain is unchanged.  he has failed to lose any weight and has yet to start exercising.  He has been taking Vicodin aobut 1 x a day for back pain which is over the L lower side w/o radiation.    He is on metformin for IFG.  Admits to a poor diet with beer every day.  His obesity has caused high cholesterol, elevated liver enzymes and LBP.  He is also still smoking.  He is taking his BP meds w/o problem but he continues to run high.  Current Medications (verified): 1)  Fluoxetine Hcl 20 Mg Caps (Fluoxetine Hcl) .Marland Kitchen.. 1 Capsule By Mouth Daily 2)  Pravastatin Sodium 80 Mg Tabs (Pravastatin Sodium) .Marland Kitchen.. 1 Tab By Mouth Qhs 3)  Enalapril-Hydrochlorothiazide 10-25 Mg Tabs (Enalapril-Hydrochlorothiazide) .Marland Kitchen.. 1 Tab By Mouth Daily 4)  Metformin Hcl 500 Mg Tabs (Metformin Hcl) .Marland Kitchen.. 1 Tab By Mouth Bid 5)  Flexeril 10 Mg Tabs (Cyclobenzaprine Hcl) .Marland Kitchen.. 1 Tab By Mouth At Bedtime As Needed Back Pain 6)  Vicodin 5-500 Mg Tabs (Hydrocodone-Acetaminophen) .Marland Kitchen.. 1-2 Tabs By Mouth Three Times A Day As Needed Severe Back Pain, Take With Food  Allergies (verified): No Known Drug Allergies  Past History:  Past Medical History: Reviewed history from 09/19/2009 and no changes  required. Depression/ anxiety IFG/ borderline T2DM Hyperlipidemia Class III obesity HTN Smoker  Social History: Reviewed history from 09/30/2008 and no changes required. Unemployed.  Used to work at [employer] co. Finished HS. Lives with fiance, Corbin Ade.  Has a grown son and 43 yo daughter, shares custody. Smokes 1/2 ppd x 17 yrs. Drinks >10/ wk. No exercise.  Poor diet.    Review of Systems      See HPI  Physical Exam  General:  obese WM in NAD Head:  normocephalic and atraumatic.   Eyes:  pupils equal, pupils round, and pupils reactive to light.   Ears:  no external deformities.   Mouth:  pharynx pink and moist.   Neck:  no masses.   Lungs:  Normal respiratory effort, chest expands symmetrically. Lungs are clear to auscultation, no crackles or wheezes. Heart:  Normal rate and regular rhythm. S1 and S2 normal without gallop, murmur, click, rub or other extra sounds. Abdomen:  truncal obesity  Msk:  tender over L quadratus lumborum but no palpable spasm and full active L spine ROM.  Nontender over SI notches.  Neg seated straight leg raise and normal gait Extremities:  no LE edema Neurologic:  sensation  intact to light touch, gait normal, and DTRs symmetrical and normal.   Skin:  color normal.   Cervical Nodes:  No lymphadenopathy noted Psych:  good eye contact, not anxious appearing, and not depressed appearing.     Impression & Recommendations:  Problem # 1:  ESSENTIAL HYPERTENSION, BENIGN (ICD-401.1) Improved but DBP remains high.  Likely related to poor diet, lack of exercise and a BMI >40. Will change his Enalapril-HCTZ to Enalapril + Chlorthalidone which can have better BP reduction. Update CMP today and f/u in 2-3 mos. His updated medication list for this problem includes:    Enalapril Maleate 10 Mg Tabs (Enalapril maleate) .Marland Kitchen... 1 tab by mouth qam    Chlorthalidone 25 Mg Tabs (Chlorthalidone) .Marland Kitchen... 1 tab by mouth qam  Orders: T-Comprehensive Metabolic  Panel ([id number])  BP today: 135/92 Prior BP: 152/96 (10/03/2009)  Labs Reviewed: K+: 4.5 (05/30/2009) Creat: : 0.85 (05/30/2009)   Chol: 245 (04/17/2009)   HDL: 46 (04/17/2009)   LDL: See Comment mg/dL (09/81/1914)   TG: 782 (04/17/2009)  Problem # 2:  LUMBAGO (ICD-724.2) Mechanical LBP related to truncal obesity and lack of exercise. He needs to work on weight loss.  This will be his last RX for Vicodin.  Start Naprosyn instead for back pain.  Home PT h/o given for LB stretches.   The following medications were removed from the medication list:    Flexeril 10 Mg Tabs (Cyclobenzaprine hcl) .Marland Kitchen... 1 tab by mouth at bedtime as needed back pain His updated medication list for this problem includes:    Vicodin 5-500 Mg Tabs (Hydrocodone-acetaminophen) .Marland Kitchen... 1-2 tabs by mouth three times a day as needed severe back pain, take with food    Naprosyn 500 Mg Tabs (Naproxen) .Marland Kitchen... 1 tab by mouth two times a day with food as needed back pain  Problem # 3:  DEPRESSION, RECURRENT (ICD-311) Improved with Fluoxetine.  RFd.   His updated medication list for this problem includes:    Fluoxetine Hcl 20 Mg Caps (Fluoxetine hcl) .Marland Kitchen... 1 capsule by mouth daily  Problem # 4:  IMPAIRED FASTING GLUCOSE (ICD-790.21) Continue Metformin.  Work on diet, exercise, wt loss.  A1C at next visit. His updated medication list for this problem includes:    Metformin Hcl 500 Mg Tabs (Metformin hcl) .Marland Kitchen... 1 tab by mouth bid  Complete Medication List: 1)  Fluoxetine Hcl 20 Mg Caps (Fluoxetine hcl) .Marland Kitchen.. 1 capsule by mouth daily 2)  Pravastatin Sodium 80 Mg Tabs (Pravastatin sodium) .Marland Kitchen.. 1 tab by mouth qhs 3)  Enalapril Maleate 10 Mg Tabs (Enalapril maleate) .Marland Kitchen.. 1 tab by mouth qam 4)  Metformin Hcl 500 Mg Tabs (Metformin hcl) .Marland Kitchen.. 1 tab by mouth bid 5)  Vicodin 5-500 Mg Tabs (Hydrocodone-acetaminophen) .Marland Kitchen.. 1-2 tabs by mouth three times a day as needed severe back pain, take with food 6)  Naprosyn 500 Mg Tabs (Naproxen)  .Marland Kitchen.. 1 tab by mouth two times a day with food as needed back pain 7)  Chlorthalidone 25 Mg Tabs (Chlorthalidone) .Marland Kitchen.. 1 tab by mouth qam  Other Orders: T-LDL Direct ([id number])  Patient Instructions: 1)  Update labs today. 2)  Work on [employer] and regular exercise. 3)  Start low back stretches. 4)  Use Naprosyn for back pain and save vicodin for severe pain.  No more RFs will be given. 5)  Change Enalapril/HCTZ to plain Enalapril + Chlorthalidone for high BP. 6)  Return for f/u mood/ BP/ A1C in 3 mos. Prescriptions: NAPROSYN 500 MG TABS (  NAPROXEN) 1 tab by mouth two times a day with food as needed back pain  #60 x 1   Entered and Authorized by:   Seymour Bars DO   Signed by:   Seymour Bars DO on 12/05/2009   Method used:   Electronically to        [facility] [phone]* (retail)       [address] [place], [place]  [address]       Ph: [phone]       Fax: [phone]   RxID:   [id number] VICODIN 5-500 MG TABS (HYDROCODONE-ACETAMINOPHEN) 1-2 tabs by mouth three times a day as needed severe back pain, take with food  #30 x 0   Entered and Authorized by:   Seymour Bars DO   Signed by:   Seymour Bars DO on 12/05/2009   Method used:   Printed then faxed to ...       [address] [phone]* (retail)       [address] [place], [place]  [address]       Ph: [phone]       Fax: [phone]   RxID:   [id number] FLUOXETINE HCL 20 MG CAPS (FLUOXETINE HCL) 1 capsule by mouth daily  #90 x 0   Entered and Authorized by:   Seymour Bars DO   Signed by:   Seymour Bars DO on 12/05/2009   Method used:   Electronically to        [facility] [phone]* (retail)       [address] [place], [place]  [address]       Ph: [phone]       Fax: [phone]   RxID:   [id number] CHLORTHALIDONE 25 MG TABS (CHLORTHALIDONE) 1 tab by mouth qAM  #90 x 1   Entered and Authorized by:   Seymour Bars DO   Signed by:   Seymour Bars DO on 12/05/2009   Method used:    Electronically to        [facility] [phone]* (retail)       [address] [place], [place]  [address]       Ph: [phone]       Fax: [phone]   RxID:   [id number] ENALAPRIL MALEATE 10 MG TABS (ENALAPRIL MALEATE) 1 tab by mouth qAM  #90 x 1   Entered and Authorized by:   Seymour Bars DO   Signed by:   Seymour Bars DO on 12/05/2009   Method used:   Electronically to        [facility] [phone]* (retail)       [address] [place], [place]  [address]       Ph: [phone]       Fax: [phone]   RxID:   [id number] FLUOXETINE HCL 20 MG CAPS (FLUOXETINE HCL) 1 capsule by mouth daily  #30 x 3   Entered by:   [name] CMA   Authorized by:   Seymour Bars DO   Signed by:   Seymour Bars DO on 12/05/2009   Method used:   Electronically to        [facility] [address] [phone]* (retail)       [address]  [place], [place]  [address]       Ph: [phone]       Fax: [phone]   RxID:   [id number]

## 2010-09-22 NOTE — Assessment & Plan Note (Signed)
Summary: dry chronic cough x 1 month/dt   Vital Signs:  Patient profile:   45 year old male Weight:      227.44 pounds BMI:     39.80 O2 Sat:      97 % on Room air Temp:     97.9 degrees F oral Pulse rate:   113 / minute Pulse rhythm:   regular Resp:     24 per minute BP sitting:   134 / 90  (right arm) Cuff size:   large  Vitals Entered By: Glendell Docker CMA (February 25, 2010 4:00 PM)  O2 Flow:  Room air CC: Rm 5- cough Comments c/o chronic cough for the past 3-4 months, he states he was evaluated in [place] and was given a rx for tussinex with no improvement   Primary Care Provider:  Seymour Bars DO  CC:  Rm 5- cough.  History of Present Illness: Mr Cornia is a 45 year old male who presents today with complaint of cough which started 4-5 months ago.  Initially started allergy meds, no improvement.  Not able to sleep.  Report that he takes prilosec- but only when his reflux is bothering him. Notes that his cough is dry and hacking.  Denies wheezing.  + smoker-  smokes 1 Pack per week.  He went to the [facility] ED last week and had a negative chest x-ray and negative rib x-ray.  (records reviewed)  Preventive Screening-Counseling & Management  Alcohol-Tobacco     Smoking Status: current  Allergies: No Known Drug Allergies  Physical Exam  General:  Morbidly obese male, appears older than stated age Lungs:  Normal respiratory effort, chest expands symmetrically. Lungs are clear to auscultation, no crackles or wheezes. Heart:  Normal rate and regular rhythm. S1 and S2 normal without gallop, murmur, click, rub or other extra sounds.   Impression & Recommendations:  Problem # 1:  COUGH (ICD-786.2) Assessment Deteriorated Patient is noted to be on an ACE inhibitor.  Will try holding ACE for now.  Will also give trial of Symbicort as patient is a smoker and COPD is in the differential.  Recommended that patient take prilosec on a daily basis for now.  He is requesting  something for MS pain/rib pain due to coughing-  naproxen in not helping.  Rx for meloxicam sent.  If no improvement with these measures, may need pulmonary evaluation. Pt instructed to keep his appointment with Dr. Cathey Endow next week.  Problem # 2:  ESSENTIAL HYPERTENSION, BENIGN (ICD-401.1) Assessment: Unchanged Will add metoprolol in place of enalapril for now to prevent rise in BP.  Ultimately, he may need to be placed on an ARB for renal protection given hx of DM- however cost is an issue for the patient at this time.  He does expect to obtain health insurance in the near future, at which time meds will hopefully be more affordable for him. The following medications were removed from the medication list:    Enalapril Maleate 10 Mg Tabs (Enalapril maleate) .Marland Kitchen... 1 tab by mouth qam His updated medication list for this problem includes:    Chlorthalidone 25 Mg Tabs (Chlorthalidone) .Marland Kitchen... 1 tab by mouth qam    Metoprolol Tartrate 25 Mg Tabs (Metoprolol tartrate) ..... One tablet by mouth daily  BP today: 134/90 Prior BP: 135/92 (12/05/2009)  Labs Reviewed: K+: 4.7 (12/05/2009) Creat: : 0.93 (12/05/2009)   Chol: 245 (04/17/2009)   HDL: 46 (04/17/2009)   LDL: See Comment mg/dL (16/05/9603)  TG: 457 (04/17/2009)  Complete Medication List: 1)  Fluoxetine Hcl 20 Mg Caps (Fluoxetine hcl) .Marland Kitchen.. 1 capsule by mouth daily 2)  Pravastatin Sodium 80 Mg Tabs (Pravastatin sodium) .Marland Kitchen.. 1 tab by mouth qhs 3)  Chlorthalidone 25 Mg Tabs (Chlorthalidone) .Marland Kitchen.. 1 tab by mouth qam 4)  Prilosec Otc 20 Mg Tbec (Omeprazole magnesium) .... One tablet by mouth daily 5)  Symbicort 160-4.5 Mcg/act Aero (Budesonide-formoterol fumarate) .... 2 puffs twice daily 6)  Mobic 7.5 Mg Tabs (Meloxicam) .... One tab by mouth daily as needed for pain 7)  Benzonatate 100 Mg Caps (Benzonatate) .... One cap by mouth three times a day as needed 8)  Metoprolol Tartrate 25 Mg Tabs (Metoprolol tartrate) .... One tablet by mouth  daily  Patient Instructions: 1)  Stop Enalapril, start Metoprolol. 2)  Please follow up with Dr. Cathey Endow as scheduled 3)  Call if fever, if symptoms worsen or if they do not improve. Prescriptions: METOPROLOL TARTRATE 25 MG TABS (METOPROLOL TARTRATE) one tablet by mouth daily  #30 x 0   Entered and Authorized by:   Lemont Fillers FNP   Signed by:   Lemont Fillers FNP on 02/25/2010   Method used:   Electronically to        [facility] [phone]* (retail)       [address]       [place], [place]  [address]       Ph: [phone]       Fax: [phone]   RxID:   [id number] BENZONATATE 100 MG CAPS (BENZONATATE) one cap by mouth three times a day as needed  #30 x 0   Entered and Authorized by:   Lemont Fillers FNP   Signed by:   Lemont Fillers FNP on 02/25/2010   Method used:   Electronically to        [facility] [phone]* (retail)       [address]       [place], [place]  [address]       Ph: [phone]       Fax: [phone]   RxID:   [id number] MOBIC 7.5 MG TABS (MELOXICAM) one tab by mouth daily as needed for pain  #15 x 0   Entered and Authorized by:   Lemont Fillers FNP   Signed by:   Lemont Fillers FNP on 02/25/2010   Method used:   Electronically to        [facility] [phone]* (retail)       [address]       [place], [place]  [address]       Ph: [phone]       Fax: [phone]   RxID:   [id number] BENZONATATE 100 MG CAPS (BENZONATATE) one cap by mouth three times a day as needed  #30 x 0   Entered and Authorized by:   Lemont Fillers FNP   Signed by:   Lemont Fillers FNP on 02/25/2010   Method used:   Electronically to        [facility] [phone]* (retail)       [address] [place], [place]  [address]       Ph: [phone]       Fax: [phone]   RxID:   [id number] MOBIC 7.5 MG TABS (MELOXICAM) one tab by mouth daily as needed for pain  #  15 x 0   Entered and Authorized by:    Lemont Fillers FNP   Signed by:   Lemont Fillers FNP on 02/25/2010   Method used:   Electronically to        [facility] [phone]* (retail)       [address] [place], [place]  [address]       Ph: [phone]       Fax: [phone]   RxID:   [id number]

## 2010-09-24 NOTE — Assessment & Plan Note (Signed)
Summary: LBP   Vital Signs:  Patient profile:   45 year old male Height:      63.5 inches Weight:      219 pounds BMI:     38.32 O2 Sat:      97 % on Room air Pulse rate:   91 / minute BP sitting:   146 / 85  (left arm) Cuff size:   large  Vitals Entered By: [name] CMA (September 18, 2010 8:51 AM)  O2 Flow:  Room air CC: Back pain flare after playing golf.    Primary Care Provider:  Seymour Bars DO  CC:  Back pain flare after playing golf. .  History of Present Illness: 45 yo WM presents for increased back pain since playing golf on Tuesday.  He has tried Naproxen but it hurts his stomach.  He has had problems with acid reflux.  He is having a hard time getting comfortable at night.  Denies change in bowel or bladder.  Denies tingling or numbness in legs or radiation of pain into buttock to down the legs.    He has hx of LBP  but had not needed pain meds in a few wks.    Current Medications (verified): 1)  Fluoxetine Hcl 20 Mg Caps (Fluoxetine Hcl) .Marland Kitchen.. 1 Capsule By Mouth Daily 2)  Percocet 5-325 Mg Tabs (Oxycodone-Acetaminophen) .Marland Kitchen.. 1 Tab By Mouth Three Times A Day As Needed Severe Back Pain 3)  Mucinex Dm 30-600 Mg Xr12h-Tab (Dextromethorphan-Guaifenesin) .Marland Kitchen.. 1 Tab By Mouth Q 12 Hrs As Needed Cough/ Congestion 4)  Omnaris 50 Mcg/act Susp (Ciclesonide) .... 2 Sprays/ Nostril Daily  Allergies (verified): 1)  ! Nsaids  Past History:  Past Medical History: Reviewed history from 03/06/2010 and no changes required. Depression/ anxiety IFG/ borderline T2DM Hyperlipidemia Class III obesity HTN Smoker chronic back pain  Social History: Reviewed history from 09/30/2008 and no changes required. Unemployed.  Used to work at [employer]. Finished HS. Lives with fiance, Corbin Ade.  Has a grown son and 72 yo daughter, shares custody. Smokes 1/2 ppd x 17 yrs. Drinks >10/ wk. No exercise.  Poor diet.    Review of Systems      See HPI  Physical  Exam  General:  alert, well-developed, well-nourished, and well-hydrated.  obese Msk:  tenderness with limited full L spine flex, full extension and tenderness with trunk rotation to the R. tender with paraspinal fullness on the R from T9-L2 with fullness over lat muscles neg seated straight leg raise, gait normal Extremities:  no LE edema Neurologic:  gait normal and DTRs symmetrical and normal.     Impression & Recommendations:  Problem # 1:  LUMBAGO (ICD-724.2) Hx and exam c/w MSK LBP with muscle spasm from golfing with hx of underlying chronic LBP. Given hx of NSAID induced gastric irritation, will give limited # of Percocet with Flexeril at night.  Once Percocet is completed, he can change over to Extra Strength Tylenol.  Advised relative rest, gentle stretching and as needed use of heating pad. If not improving, will start PT or chiropractic treatment.  Pt understands that wt loss is important long term. His updated medication list for this problem includes:    Percocet 5-325 Mg Tabs (Oxycodone-acetaminophen) .Marland Kitchen... 1 tab by mouth three times a day as needed severe back pain    Flexeril 10 Mg Tabs (Cyclobenzaprine hcl) .Marland Kitchen... 1 tab by mouth at bedtime as needed back spasm  Complete Medication List: 1)  Fluoxetine Hcl  20 Mg Caps (Fluoxetine hcl) .Marland Kitchen.. 1 capsule by mouth daily 2)  Percocet 5-325 Mg Tabs (Oxycodone-acetaminophen) .Marland Kitchen.. 1 tab by mouth three times a day as needed severe back pain 3)  Omnaris 50 Mcg/act Susp (Ciclesonide) .... 2 sprays/ nostril daily 4)  Flexeril 10 Mg Tabs (Cyclobenzaprine hcl) .Marland Kitchen.. 1 tab by mouth at bedtime as needed back spasm  Patient Instructions: 1)  For back pain -->  2)  Use Percocet up to 3 x a day 3)  Use sparingly. 4)  Use Flexeril at bedtime for back spasm. 5)  Gentle stretching. 6)  After percocet has been used, change to Extra Strength Tylenol if needed. Prescriptions: FLEXERIL 10 MG TABS (CYCLOBENZAPRINE HCL) 1 tab by mouth at bedtime as  needed back spasm  #30 x 1   Entered and Authorized by:   Seymour Bars DO   Signed by:   Seymour Bars DO on 09/18/2010   Method used:   Electronically to        [facility] [phone]* (retail)       [address] [place], [place]  [address]       Ph: [phone]       Fax: [phone]   RxID:   [id number] PERCOCET 5-325 MG TABS (OXYCODONE-ACETAMINOPHEN) 1 tab by mouth three times a day as needed severe back pain  #40 x 0   Entered and Authorized by:   Seymour Bars DO   Signed by:   Seymour Bars DO on 09/18/2010   Method used:   Print then Give to Patient   RxID:   [id number] FLUOXETINE HCL 20 MG CAPS (FLUOXETINE HCL) 1 capsule by mouth daily  #90 x 1   Entered by:   [name] CMA   Authorized by:   Seymour Bars DO   Signed by:   Seymour Bars DO on 09/18/2010   Method used:   Electronically to        [facility] [phone]* (retail)       [address] [place], [place]  [address]       Ph: [phone]       Fax: [phone]   RxID:   [id number]    Orders Added: 1)  Est. Patient Level III [20254]

## 2010-09-25 NOTE — Letter (Signed)
Summary: Depression Questionnaire  Depression Questionnaire   Imported By: Lanelle Bal 03/13/2010 14:12:34  _____________________________________________________________________  External Attachment:    Type:   Image     Comment:   External Document

## 2010-10-12 ENCOUNTER — Encounter: Payer: Self-pay | Admitting: Family Medicine

## 2010-11-04 ENCOUNTER — Encounter: Payer: Self-pay | Admitting: Family Medicine

## 2010-11-06 LAB — RAPID STREP SCREEN (MED CTR MEBANE ONLY): Streptococcus, Group A Screen (Direct): NEGATIVE

## 2010-11-06 LAB — STREP A DNA PROBE: Group A Strep Probe: NEGATIVE

## 2010-11-13 ENCOUNTER — Encounter: Payer: Self-pay | Admitting: Family Medicine

## 2010-11-13 ENCOUNTER — Ambulatory Visit (INDEPENDENT_AMBULATORY_CARE_PROVIDER_SITE_OTHER): Payer: Self-pay | Admitting: Family Medicine

## 2010-11-13 DIAGNOSIS — M545 Low back pain: Secondary | ICD-10-CM

## 2010-11-13 DIAGNOSIS — I10 Essential (primary) hypertension: Secondary | ICD-10-CM

## 2010-11-13 MED ORDER — OXYCODONE-ACETAMINOPHEN 5-325 MG PO TABS: 1.0000 | ORAL_TABLET | Freq: Two times a day (BID) | ORAL | Status: DC

## 2010-11-13 MED ORDER — HYDROCHLOROTHIAZIDE 12.5 MG PO TABS: 25.0000 mg | ORAL_TABLET | Freq: Every day | ORAL | Status: DC

## 2010-11-13 MED ORDER — HYDROCHLOROTHIAZIDE 25 MG PO TABS: ORAL_TABLET | ORAL | Status: DC

## 2010-11-13 NOTE — Assessment & Plan Note (Signed)
BP remains high and he reports a hx of ACE cough, so will start HCTZ 25 mg once daily and recheck in 3 mos.  We discussed a low sodium diet, regular exercise and wt loss as part of his treatment plan.

## 2010-11-13 NOTE — Assessment & Plan Note (Signed)
Unchanged.  Mr Maselli continues to have chronic LBP w/o red flags and w/o radiculopathy.  His NSAID use is limited by dyspepsia.  We again discussed a comprehensive plan for his LBP which includes physical therapy or chiropractic therapy, weight loss, exercise and not just use of narcotics.  He did not have any improvements on his Flexeril so removed from his list.  I gave him #30 Percocet to use sparingly.  Can RF q 2 mos short term until he gets insurance to seek out additional therapy.

## 2010-11-13 NOTE — Progress Notes (Signed)
  Subjective:    Patient ID: Gerald West, male    DOB: 03/26/1966, 45 y.o.   MRN: [id number]  HPI45 yo obese WM presents for f/u chronic LBP, unchanged from 2 mos ago.  He has had LBP for years w/o radiation into the buttocks or legs.  Denies weakness, tingling or numbness.  Has been using some ibuprofen but it irritates his stomache, limiting his use.  He has pain on and off and the flexeril only made him feel 'dopey'.  Inconsistent with doing home stretches and is still not exercising regularly.  Had GI upset on vicodin in the past but using percocet sparingly has helped.  His last RX in Jan was for #40 and he just ran out.  He is not on anything for his HTN now.    BP 147/102  Pulse 90  Ht 5\' 5"  (1.651 m)  Wt 218 lb (98.884 kg)  BMI 36.28 kg/m2  SpO2 97% Review of Systems  Denies change in bowel or bladder function.  Denies weakness, chest pain, palpitations, edema or SOB.     Objective:   Physical Exam  Constitutional: He appears well-developed and well-nourished.       Truncal obesity  Neck: Normal range of motion. No thyromegaly present.  Cardiovascular: Normal rate, regular rhythm, normal heart sounds and intact distal pulses.   No murmur heard. Pulmonary/Chest: Effort normal and breath sounds normal.  Musculoskeletal: He exhibits no edema.       Lumbar back: He exhibits tenderness. He exhibits normal range of motion, no swelling and no deformity.          Assessment & Plan:

## 2010-11-13 NOTE — Patient Instructions (Signed)
Alternate Ibuprofen with Percocet for back pain. Try to do home stretches each day and work on weight loss. OK to use a heating pad and thermacare heat wraps.  Start HCTZ once daily for high BP.  Return for f/u in 3 mos.

## 2010-11-13 NOTE — Progress Notes (Deleted)
  Subjective:    Patient ID: Gerald West, male    DOB: July 01, 1966, 45 y.o.   MRN: [id number]  HPI    Review of Systems     Objective:   Physical Exam        Assessment & Plan:

## 2010-11-13 NOTE — Progress Notes (Signed)
Addended by: Seymour Bars on: 11/13/2010 11:45 AM   Modules accepted: Orders

## 2010-12-24 ENCOUNTER — Other Ambulatory Visit: Payer: Self-pay | Admitting: Family Medicine

## 2010-12-24 NOTE — Telephone Encounter (Signed)
Pt contacted that his percocet Rx will be ready tomorrow and he can pickup tomorrow after lunchtime.  Told him Dr. Cathey Endow said he should be good on the Prozac.  Pt said he only had approx 11 left . Gerald Newcomer, LPN [facility]

## 2010-12-24 NOTE — Telephone Encounter (Signed)
Dr, Cathey Endow patient left message on triage nurse VM. Jarvis Newcomer, LPN [facility]

## 2010-12-24 NOTE — Telephone Encounter (Signed)
Pt called and requested on VM for Dr. Cathey Endow nurse to call so he could get RX RF. Plan:  Called pt to find out what he needed a refill on, and instructed will call him if there is a problem with his request to fill  A.  Percocet 5/325mg  (1) po Q4-6h PRN, and  B.  Prozac 20 mg PO daily.  Uses [facility].   Otherwise pt is to call pharmacy later today to see if his scripts ready.  Prozac last filled on 01 24 12 # 90/1rf, and Percocet last filled on 03 23 12.   Plan:  Routed  To Dr. Arlice Colt, LPN [facility]

## 2010-12-24 NOTE — Telephone Encounter (Signed)
He should be good on the Prozac.  I will print out the Percocet and he can pick this Up here tomorrow.  Cannot be faxed or called in.

## 2010-12-25 ENCOUNTER — Other Ambulatory Visit: Payer: Self-pay | Admitting: *Deleted

## 2010-12-25 MED ORDER — FLUOXETINE HCL 20 MG PO CAPS: 20.0000 mg | ORAL_CAPSULE | Freq: Every day | ORAL | Status: DC

## 2010-12-25 MED ORDER — OXYCODONE-ACETAMINOPHEN 5-325 MG PO TABS: 1.0000 | ORAL_TABLET | Freq: Two times a day (BID) | ORAL | Status: DC

## 2010-12-27 MED ORDER — FLUOXETINE HCL 20 MG PO CAPS: 20.0000 mg | ORAL_CAPSULE | Freq: Every day | ORAL | Status: DC

## 2010-12-27 NOTE — Telephone Encounter (Signed)
prozac sent

## 2011-02-05 ENCOUNTER — Ambulatory Visit (INDEPENDENT_AMBULATORY_CARE_PROVIDER_SITE_OTHER): Payer: Self-pay | Admitting: Family Medicine

## 2011-02-05 ENCOUNTER — Encounter: Payer: Self-pay | Admitting: Family Medicine

## 2011-02-05 DIAGNOSIS — E785 Hyperlipidemia, unspecified: Secondary | ICD-10-CM

## 2011-02-05 DIAGNOSIS — I10 Essential (primary) hypertension: Secondary | ICD-10-CM

## 2011-02-05 DIAGNOSIS — M545 Low back pain: Secondary | ICD-10-CM

## 2011-02-05 MED ORDER — OXYCODONE-ACETAMINOPHEN 5-325 MG PO TABS: 1.0000 | ORAL_TABLET | Freq: Two times a day (BID) | ORAL | Status: DC

## 2011-02-05 MED ORDER — ATENOLOL-CHLORTHALIDONE 50-25 MG PO TABS: 1.0000 | ORAL_TABLET | Freq: Every day | ORAL | Status: DC

## 2011-02-05 NOTE — Assessment & Plan Note (Signed)
BP is still high and he is not taking his HCTZ everyday.  Continue to work on diet, exercise and wt loss.  Changed his RX medicine to Atenolol-Chlorthalidone once daily.  Update a BMP and LDL (minimum labs since he does not have insurance).  Call if any problems on new meds. Hx of ace cough.    Recheck in 2-3 mos.

## 2011-02-05 NOTE — Patient Instructions (Signed)
#  60 percocet to last for 2 months*  Work on home back stretches, continued weight loss, inversion table.  Update labs next wk downstairs, non fasting.  Change HCTZ to Atenolol- Chlorthalidone one tab daily for high blood pressure.  REturn for f/u BP in 2-3 mos.

## 2011-02-05 NOTE — Assessment & Plan Note (Signed)
Reviewed his plan of care.  Due to cost, I filled him Percocet for #60 but this needs to last him for 2 mos.  He understands this and will continue to work on home back stretches, wt loss, regular exercise and is looking into getting an inversion table.

## 2011-02-05 NOTE — Progress Notes (Signed)
  Subjective:    Patient ID: ONAJE WARNE, male    DOB: [birth year], 45 y.o.   MRN: [id number]  HPI 45 yo WM presents for f/u HTN, hyperlipidemia and chronic low back back.  He is down 6 lbs in the past 3 mos.  He admits to forgetting to take his HCTZ some days.  Hx of ACE cough.  He does not have insurance.  Trying to do more home back stretches.  Has NSAID induced dyspepsia.    He is due for labs.     Review of Systems  Constitutional: Negative for fatigue and unexpected weight change.  Eyes: Negative for visual disturbance.  Respiratory: Negative for shortness of breath.   Cardiovascular: Negative for chest pain, palpitations and leg swelling.  Psychiatric/Behavioral: Negative for dysphoric mood. The patient is not nervous/anxious.        Objective:   Physical Exam  Constitutional: He appears well-developed and well-nourished. No distress.  HENT:  Mouth/Throat: Oropharynx is clear and moist.  Eyes: Pupils are equal, round, and reactive to light.  Neck: No thyromegaly present.  Cardiovascular: Normal rate, regular rhythm and normal heart sounds.   Pulmonary/Chest: Effort normal and breath sounds normal. No respiratory distress. He has no wheezes.  Musculoskeletal: He exhibits no edema.       Normal gait  Skin: Skin is warm and dry.  Psychiatric: He has a normal mood and affect.          Assessment & Plan:

## 2011-03-22 ENCOUNTER — Other Ambulatory Visit: Payer: Self-pay | Admitting: Family Medicine

## 2011-03-22 MED ORDER — OXYCODONE-ACETAMINOPHEN 5-325 MG PO TABS: 1.0000 | ORAL_TABLET | Freq: Two times a day (BID) | ORAL | Status: DC

## 2011-03-22 NOTE — Telephone Encounter (Signed)
Pt called for refill of his oxycodone.  Refilled # 60/0 refills for back pain. Pt aware to pup. Jarvis Newcomer, LPN [facility]

## 2011-04-23 ENCOUNTER — Encounter: Payer: Self-pay | Admitting: Family Medicine

## 2011-04-28 ENCOUNTER — Telehealth: Payer: Self-pay | Admitting: *Deleted

## 2011-04-28 MED ORDER — FLUOXETINE HCL 20 MG PO CAPS: 20.0000 mg | ORAL_CAPSULE | Freq: Every day | ORAL | Status: DC

## 2011-04-28 NOTE — Telephone Encounter (Signed)
Not due until 9/30.  60 tabs is to last him 2 months.  He also needs to sign  Pain contract when he coes to get next refill at the end of the month.

## 2011-04-28 NOTE — Telephone Encounter (Signed)
Pt would like a refill on his Percocet 5/325mg 

## 2011-04-28 NOTE — Telephone Encounter (Signed)
Pt notified of MD instructions. KJ LPN 

## 2011-05-07 ENCOUNTER — Ambulatory Visit: Payer: Self-pay | Admitting: Family Medicine

## 2011-05-07 DIAGNOSIS — Z0289 Encounter for other administrative examinations: Secondary | ICD-10-CM

## 2011-05-19 ENCOUNTER — Other Ambulatory Visit: Payer: Self-pay | Admitting: Family Medicine

## 2011-05-19 MED ORDER — OXYCODONE-ACETAMINOPHEN 5-325 MG PO TABS: 1.0000 | ORAL_TABLET | Freq: Two times a day (BID) | ORAL | Status: DC

## 2011-05-19 NOTE — Telephone Encounter (Signed)
Pt calling for refill of his percocet and says he is not due for refill until Sunday.  Wants to know if he can pup a script this Friday and not fill until Sunday.  Pt last refill 03-22-11.  Pt is due to get the refill. Plan:  Printed script for provider signature.  Pt to pup on Friday  05-21-11 between 1-2pm. Jarvis Newcomer, LPN [facility]

## 2011-06-25 ENCOUNTER — Telehealth: Payer: Self-pay | Admitting: *Deleted

## 2011-06-25 ENCOUNTER — Encounter: Payer: Self-pay | Admitting: *Deleted

## 2011-06-25 ENCOUNTER — Encounter: Payer: Self-pay | Admitting: Family Medicine

## 2011-06-25 ENCOUNTER — Ambulatory Visit (INDEPENDENT_AMBULATORY_CARE_PROVIDER_SITE_OTHER): Payer: Self-pay | Admitting: Family Medicine

## 2011-06-25 VITALS — BP 132/79 | HR 62 | Wt 216.0 lb

## 2011-06-25 DIAGNOSIS — Z23 Encounter for immunization: Secondary | ICD-10-CM

## 2011-06-25 DIAGNOSIS — I10 Essential (primary) hypertension: Secondary | ICD-10-CM

## 2011-06-25 DIAGNOSIS — M549 Dorsalgia, unspecified: Secondary | ICD-10-CM

## 2011-06-25 MED ORDER — OXYCODONE-ACETAMINOPHEN 5-325 MG PO TABS: 1.0000 | ORAL_TABLET | Freq: Two times a day (BID) | ORAL | Status: DC

## 2011-06-25 NOTE — Patient Instructions (Signed)
Calorie Counting Diet A calorie counting diet requires you to eat the number of calories that are right for you in a day. Calories are the measurement of how much energy you get from the food you eat. Eating the right amount of calories is important for staying at a healthy weight. If you eat too many calories, your body will store them as fat and you may gain weight. If you eat too few calories, you may lose weight. Counting the number of calories you eat during a day will help you know if you are eating the right amount. A Registered Dietitian can determine how many calories you need in a day. The amount of calories needed varies from person to person. If your goal is to lose weight, you will need to eat fewer calories. Losing weight can benefit you if you are overweight or have health problems such as heart disease, high blood pressure, or diabetes. If your goal is to gain weight, you will need to eat more calories. Gaining weight may be necessary if you have a certain health problem that causes your body to need more energy. TIPS Whether you are increasing or decreasing the number of calories you eat during a day, it may be hard to get used to changes in what you eat and drink. The following are tips to help you keep track of the number of calories you eat.  Measure foods at home with measuring cups. This helps you know the amount of food and number of calories you are eating.   Restaurants often serve food in amounts that are larger than 1 serving. While eating out, estimate how many servings of a food you are given. For example, a serving of cooked rice is  cup or about the size of half of a fist. Knowing serving sizes will help you be aware of how much food you are eating at restaurants.   Ask for smaller portion sizes or child-size portions at restaurants.   Plan to eat half of a meal at a restaurant. Take the rest home or share the other half with a friend.   Read the Nutrition Facts panel on  food labels for calorie content and serving size. You can find out how many servings are in a package, the size of a serving, and the number of calories each serving has.   For example, a package might contain 3 cookies. The Nutrition Facts panel on that package says that 1 serving is 1 cookie. Below that, it will say there are 3 servings in the container. The calories section of the Nutrition Facts label says there are 90 calories. This means there are 90 calories in 1 cookie (1 serving). If you eat 1 cookie you have eaten 90 calories. If you eat all 3 cookies, you have eaten 270 calories (3 servings x 90 calories = 270 calories).  The list below tells you how big or small some common portion sizes are.  1 oz.........4 stacked dice.   3 oz.........Deck of cards.   1 tsp........Tip of little finger.   1 tbs........Thumb.   2 tbs........Golf ball.    cup.......Half of a fist.   1 cup........A fist.  KEEP A FOOD LOG Write down every food item you eat, the amount you eat, and the number of calories in each food you eat during the day. At the end of the day, you can add up the total number of calories you have eaten. It may help to keep a   list like the one below. Find out the calorie information by reading the Nutrition Facts panel on food labels. Breakfast  Bran cereal (1 cup, 110 calories).   Fat-free milk ( cup, 45 calories).  Snack  Apple (1 medium, 80 calories).  Lunch  Spinach (1 cup, 20 calories).   Tomato ( medium, 20 calories).   Chicken breast strips (3 oz, 165 calories).   Shredded cheddar cheese ( cup, 110 calories).   Light Italian dressing (2 tbs, 60 calories).   Whole-wheat bread (1 slice, 80 calories).   Tub margarine (1 tsp, 35 calories).   Vegetable soup (1 cup, 160 calories).  Dinner  Pork chop (3 oz, 190 calories).   Brown rice (1 cup, 215 calories).   Steamed broccoli ( cup, 20 calories).   Strawberries (1  cup, 65 calories).   Whipped  cream (1 tbs, 50 calories).  Daily Calorie Total: 1425 Document Released: 08/09/2005 Document Revised: 04/21/2011 Document Reviewed: 02/03/2007 [employer] Patient Information 2012 [employer]. 

## 2011-06-25 NOTE — Progress Notes (Signed)
Subjective:    Patient ID: COSIMO SCHERTZER, male    DOB: 01-31-1966, 45 y.o.   MRN: [id number]  HPI This is the note form his appt in March with Dr. Cathey Endow "Mr Adachi continues to have chronic LBP w/o red flags and w/o radiculopathy. His NSAID use is limited by dyspepsia. We again discussed a comprehensive plan for his LBP which includes physical therapy or chiropractic therapy, weight loss, exercise and not just use of narcotics. He did not have any improvements on his Flexeril so removed from his list. I gave him #30 Percocet to use sparingly. Can RF q 2 mos short term until he gets insurance to seek out additional therapy".   He has a history of back pain for the last 7-8 years. He says he's been using 2 of the hydrocodone daily and that works well for him. He says occasionally he will be able to only take one but other days he will need to take 3. He would like to up his prescription to getting 2 tabs per day. He still does not have insurance and is unable to participate in physical therapy or chiropractic care. He says he has a niece in the past. Is not currently getting any regular exercise and is not very physically active. He has gained 4 pounds since his last office visit.       Review of Systems     BP 132/79  Pulse 62  Wt 216 lb (97.977 kg)    Allergies  Allergen Reactions  . Ace Inhibitors Cough  . Nsaids     REACTION: gastritis    Past Medical History  Diagnosis Date  . Anxiety   . Depression   . IFG (impaired fasting glucose)     Borderline T2DM  . Hyperlipidemia   . Obesity     Class III  . Hypertension   . Smoker   . Chronic back pain     No past surgical history on file.  History   Social History  . Marital Status: Married    Spouse Name: N/A    Number of Children: N/A  . Years of Education: N/A   Occupational History  . Not on file.   Social History Main Topics  . Smoking status: Current Everyday Smoker -- 0.5 packs/day for 17 years  .  Smokeless tobacco: Not on file  . Alcohol Use: 5.0 oz/week    10 drink(s) per week     >10 a week  . Drug Use:   . Sexually Active:    Other Topics Concern  . Not on file   Social History Narrative  . No narrative on file    Family History  Problem Relation Age of Onset  . Other Brother 37    AMI  . Depression Neg Hx   . Bipolar disorder Neg Hx     Mr. Markey had no medications administered during this visit.  Objective:   Physical Exam  Constitutional: He is oriented to person, place, and time. He appears well-developed and well-nourished.  HENT:  Head: Normocephalic and atraumatic.  Cardiovascular: Normal rate, regular rhythm and normal heart sounds.   Pulmonary/Chest: Effort normal and breath sounds normal.  Neurological: He is alert and oriented to person, place, and time.  Skin: Skin is warm and dry.  Psychiatric: He has a normal mood and affect. His behavior is normal.          Assessment & Plan:  Back Pain- Not improving. I did  increase his hydrocodone to 2 tabs daily. This is #60 tablets per month. He did sign a pain contract today. I did encourage him that once he is able to get insurance indefinitely we'll get him into chiropractic care or physical therapy. I explained that this will not fix his back but certainly it could make it better today he is relying less on narcotics. We discussed dependency and tolerance issues that build. Patient says he understands. I discussed with him the fact that he reads a comprehensive care plan for his back. He says he does do stretches on his own. I encouraged him to start a regular walking program a series of shunt this improves chronic back pain. I also discussed what his normal BMI is about his goal should be. I would like to see him at least get down into the overweight range which would be under 179 pounds. He says he will work on this.  HTN - Looks good today. Continue current regimen. Work on weight change.   Flu shot  given.

## 2011-06-25 NOTE — Telephone Encounter (Signed)
error 

## 2011-07-22 ENCOUNTER — Other Ambulatory Visit: Payer: Self-pay | Admitting: *Deleted

## 2011-07-22 NOTE — Telephone Encounter (Signed)
Pt would like a refill on his Percocet. Will pick up tomorrow around lunchtime

## 2011-07-23 MED ORDER — OXYCODONE-ACETAMINOPHEN 5-325 MG PO TABS: 1.0000 | ORAL_TABLET | Freq: Two times a day (BID) | ORAL | Status: DC

## 2011-07-23 NOTE — Telephone Encounter (Signed)
Pt has pain contract

## 2011-07-23 NOTE — Telephone Encounter (Signed)
I printed a prescription. Please double check to see if he is on a pain contract. If not then let him come in and try to sign one before his next refill.

## 2011-08-19 ENCOUNTER — Other Ambulatory Visit: Payer: Self-pay | Admitting: *Deleted

## 2011-08-19 MED ORDER — OXYCODONE-ACETAMINOPHEN 5-325 MG PO TABS: 1.0000 | ORAL_TABLET | Freq: Two times a day (BID) | ORAL | Status: DC

## 2011-09-16 ENCOUNTER — Other Ambulatory Visit: Payer: Self-pay | Admitting: *Deleted

## 2011-09-16 MED ORDER — OXYCODONE-ACETAMINOPHEN 5-325 MG PO TABS: 1.0000 | ORAL_TABLET | Freq: Two times a day (BID) | ORAL | Status: DC

## 2011-10-01 ENCOUNTER — Ambulatory Visit: Payer: Self-pay | Admitting: Family Medicine

## 2011-10-18 ENCOUNTER — Other Ambulatory Visit: Payer: Self-pay | Admitting: *Deleted

## 2011-10-18 MED ORDER — OXYCODONE-ACETAMINOPHEN 5-325 MG PO TABS: 1.0000 | ORAL_TABLET | Freq: Two times a day (BID) | ORAL | Status: DC

## 2011-10-28 ENCOUNTER — Emergency Department (HOSPITAL_BASED_OUTPATIENT_CLINIC_OR_DEPARTMENT_OTHER)
Admission: EM | Admit: 2011-10-28 | Discharge: 2011-10-28 | Disposition: A | Discharge status: Home / Self Care | Payer: Self-pay | Attending: Emergency Medicine | Admitting: Emergency Medicine

## 2011-10-28 ENCOUNTER — Emergency Department (INDEPENDENT_AMBULATORY_CARE_PROVIDER_SITE_OTHER): Payer: Self-pay

## 2011-10-28 ENCOUNTER — Encounter (HOSPITAL_BASED_OUTPATIENT_CLINIC_OR_DEPARTMENT_OTHER): Payer: Self-pay | Admitting: *Deleted

## 2011-10-28 DIAGNOSIS — M549 Dorsalgia, unspecified: Secondary | ICD-10-CM

## 2011-10-28 DIAGNOSIS — R059 Cough, unspecified: Secondary | ICD-10-CM

## 2011-10-28 DIAGNOSIS — M545 Low back pain, unspecified: Secondary | ICD-10-CM | POA: Insufficient documentation

## 2011-10-28 DIAGNOSIS — R05 Cough: Secondary | ICD-10-CM

## 2011-10-28 DIAGNOSIS — E669 Obesity, unspecified: Secondary | ICD-10-CM | POA: Insufficient documentation

## 2011-10-28 DIAGNOSIS — E785 Hyperlipidemia, unspecified: Secondary | ICD-10-CM | POA: Insufficient documentation

## 2011-10-28 DIAGNOSIS — F3289 Other specified depressive episodes: Secondary | ICD-10-CM | POA: Insufficient documentation

## 2011-10-28 DIAGNOSIS — G8929 Other chronic pain: Secondary | ICD-10-CM | POA: Insufficient documentation

## 2011-10-28 DIAGNOSIS — R112 Nausea with vomiting, unspecified: Secondary | ICD-10-CM

## 2011-10-28 DIAGNOSIS — R509 Fever, unspecified: Secondary | ICD-10-CM

## 2011-10-28 DIAGNOSIS — F411 Generalized anxiety disorder: Secondary | ICD-10-CM | POA: Insufficient documentation

## 2011-10-28 DIAGNOSIS — J40 Bronchitis, not specified as acute or chronic: Secondary | ICD-10-CM | POA: Insufficient documentation

## 2011-10-28 DIAGNOSIS — F17200 Nicotine dependence, unspecified, uncomplicated: Secondary | ICD-10-CM | POA: Insufficient documentation

## 2011-10-28 DIAGNOSIS — I10 Essential (primary) hypertension: Secondary | ICD-10-CM | POA: Insufficient documentation

## 2011-10-28 DIAGNOSIS — F329 Major depressive disorder, single episode, unspecified: Secondary | ICD-10-CM | POA: Insufficient documentation

## 2011-10-28 MED ORDER — AZITHROMYCIN 250 MG PO TABS: ORAL_TABLET | ORAL | Status: AC

## 2011-10-28 MED ORDER — ONDANSETRON 8 MG PO TBDP
8.0000 mg | ORAL_TABLET | Freq: Once | ORAL | Status: AC
  Administered 2011-10-28: 8 mg via ORAL
  Filled 2011-10-28: qty 1

## 2011-10-28 MED ORDER — HYDROCODONE-ACETAMINOPHEN 5-500 MG PO TABS: 1.0000 | ORAL_TABLET | Freq: Four times a day (QID) | ORAL | Status: AC | PRN

## 2011-10-28 MED ORDER — ONDANSETRON HCL 8 MG PO TABS: 8.0000 mg | ORAL_TABLET | Freq: Three times a day (TID) | ORAL | Status: AC | PRN

## 2011-10-28 MED ORDER — HYDROCODONE-ACETAMINOPHEN 5-325 MG PO TABS
2.0000 | ORAL_TABLET | Freq: Once | ORAL | Status: AC
  Administered 2011-10-28: 2 via ORAL
  Filled 2011-10-28: qty 2

## 2011-10-28 NOTE — Discharge Instructions (Signed)
Rest. Drink plenty of fluids. Take zofran as need for nausea. Take antibiotic as prescribed. You may try mucinex dm as need for cough. You may take vicodin as need for pain. No driving for the next 6 hours or when taking vicodin. Also, do not take tylenol or acetaminophen containing medication when taking vicodin.  Follow up with primary care doctor in next few days if symptoms fail to improve/resolve.  Also follow up with primary care doctor for recheck of blood pressure in next couple weeks as it is high today.   Return to ER if worse, persistent vomiting, trouble breathing, other concern.     Bronchitis Bronchitis is the body's way of reacting to injury and/or infection (inflammation) of the bronchi. Bronchi are the air tubes that extend from the windpipe into the lungs. If the inflammation becomes severe, it may cause shortness of breath. CAUSES  Inflammation may be caused by:  A virus.   Germs (bacteria).   Dust.   Allergens.   Pollutants and many other irritants.  The cells lining the bronchial tree are covered with tiny hairs (cilia). These constantly beat upward, away from the lungs, toward the mouth. This keeps the lungs free of pollutants. When these cells become too irritated and are unable to do their job, mucus begins to develop. This causes the characteristic cough of bronchitis. The cough clears the lungs when the cilia are unable to do their job. Without either of these protective mechanisms, the mucus would settle in the lungs. Then you would develop pneumonia. Smoking is a common cause of bronchitis and can contribute to pneumonia. Stopping this habit is the single most important thing you can do to help yourself. TREATMENT   Your caregiver may prescribe an antibiotic if the cough is caused by bacteria. Also, medicines that open up your airways make it easier to breathe. Your caregiver may also recommend or prescribe an expectorant. It will loosen the mucus to be coughed  up. Only take over-the-counter or prescription medicines for pain, discomfort, or fever as directed by your caregiver.   Removing whatever causes the problem (smoking, for example) is critical to preventing the problem from getting worse.   Cough suppressants may be prescribed for relief of cough symptoms.   Inhaled medicines may be prescribed to help with symptoms now and to help prevent problems from returning.   For those with recurrent (chronic) bronchitis, there may be a need for steroid medicines.  SEEK IMMEDIATE MEDICAL CARE IF:   During treatment, you develop more pus-like mucus (purulent sputum).   You have a fever.   Your baby is older than 3 months with a rectal temperature of 102 F (38.9 C) or higher.   Your baby is 26 months old or younger with a rectal temperature of 100.4 F (38 C) or higher.   You become progressively more ill.   You have increased difficulty breathing, wheezing, or shortness of breath.  It is necessary to seek immediate medical care if you are elderly or sick from any other disease. MAKE SURE YOU:   Understand these instructions.   Will watch your condition.   Will get help right away if you are not doing well or get worse.  Document Released: 08/09/2005 Document Revised: 07/29/2011 Document Reviewed: 06/18/2008 [facility] Patient Information 2012 [place], [place].     Back Pain, Adult Low back pain is very common. About 1 in 5 people have back pain.The cause of low back pain is rarely dangerous. The pain often  gets better over time.About half of people with a sudden onset of back pain feel better in just 2 weeks. About 8 in 10 people feel better by 6 weeks.  CAUSES Some common causes of back pain include:  Strain of the muscles or ligaments supporting the spine.   Wear and tear (degeneration) of the spinal discs.   Arthritis.   Direct injury to the back.  DIAGNOSIS Most of the time, the direct cause of low back pain is not  known.However, back pain can be treated effectively even when the exact cause of the pain is unknown.Answering your caregiver's questions about your overall health and symptoms is one of the most accurate ways to make sure the cause of your pain is not dangerous. If your caregiver needs more information, he or she may order lab work or imaging tests (X-rays or MRIs).However, even if imaging tests show changes in your back, this usually does not require surgery. HOME CARE INSTRUCTIONS For many people, back pain returns.Since low back pain is rarely dangerous, it is often a condition that people can learn to [facility] their own.   Remain active. It is stressful on the back to sit or stand in one place. Do not sit, drive, or stand in one place for more than 30 minutes at a time. Take short walks on level surfaces as soon as pain allows.Try to increase the length of time you walk each day.   Do not stay in bed.Resting more than 1 or 2 days can delay your recovery.   Do not avoid exercise or work.Your body is made to move.It is not dangerous to be active, even though your back may hurt.Your back will likely heal faster if you return to being active before your pain is gone.   Pay attention to your body when you bend and lift. Many people have less discomfortwhen lifting if they bend their knees, keep the load close to their bodies,and avoid twisting. Often, the most comfortable positions are those that put less stress on your recovering back.   Find a comfortable position to sleep. Use a firm mattress and lie on your side with your knees slightly bent. If you lie on your back, put a pillow under your knees.   Only take over-the-counter or prescription medicines as directed by your caregiver. Over-the-counter medicines to reduce pain and inflammation are often the most helpful.Your caregiver may prescribe muscle relaxant drugs.These medicines help dull your pain so you can more quickly return  to your normal activities and healthy exercise.   Put ice on the injured area.   Put ice in a plastic bag.   Place a towel between your skin and the bag.   Leave the ice on for 15 to 20 minutes, 3 to 4 times a day for the first 2 to 3 days. After that, ice and heat may be alternated to reduce pain and spasms.   Ask your caregiver about trying back exercises and gentle massage. This may be of some benefit.   Avoid feeling anxious or stressed.Stress increases muscle tension and can worsen back pain.It is important to recognize when you are anxious or stressed and learn ways to manage it.Exercise is a great option.  SEEK MEDICAL CARE IF:  You have pain that is not relieved with rest or medicine.   You have pain that does not improve in 1 week.   You have new symptoms.   You are generally not feeling well.  SEEK IMMEDIATE MEDICAL CARE  IF:   You have pain that radiates from your back into your legs.   You develop new bowel or bladder control problems.   You have unusual weakness or numbness in your arms or legs.   You develop nausea or vomiting.   You develop abdominal pain.   You feel faint.  Document Released: 08/09/2005 Document Revised: 07/29/2011 Document Reviewed: 12/28/2010 [facility] Patient Information 2012 [place], [place].     Hypertension As your heart beats, it forces blood through your arteries. This force is your blood pressure. If the pressure is too high, it is called hypertension (HTN) or high blood pressure. HTN is dangerous because you may have it and not know it. High blood pressure may mean that your heart has to work harder to pump blood. Your arteries may be narrow or stiff. The extra work puts you at risk for heart disease, stroke, and other problems.  Blood pressure consists of two numbers, a higher number over a lower, 110/72, for example. It is stated as "110 over 72." The ideal is below 120 for the top number (systolic) and under 80 for the bottom  (diastolic). Write down your blood pressure today. You should pay close attention to your blood pressure if you have certain conditions such as:  Heart failure.   Prior heart attack.   Diabetes   Chronic kidney disease.   Prior stroke.   Multiple risk factors for heart disease.  To see if you have HTN, your blood pressure should be measured while you are seated with your arm held at the level of the heart. It should be measured at least twice. A one-time elevated blood pressure reading (especially in the Emergency Department) does not mean that you need treatment. There may be conditions in which the blood pressure is different between your right and left arms. It is important to see your caregiver soon for a recheck. Most people have essential hypertension which means that there is not a specific cause. This type of high blood pressure may be lowered by changing lifestyle factors such as:  Stress.   Smoking.   Lack of exercise.   Excessive weight.   Drug/tobacco/alcohol use.   Eating less salt.  Most people do not have symptoms from high blood pressure until it has caused damage to the body. Effective treatment can often prevent, delay or reduce that damage. TREATMENT  When a cause has been identified, treatment for high blood pressure is directed at the cause. There are a large number of medications to treat HTN. These fall into several categories, and your caregiver will help you select the medicines that are best for you. Medications may have side effects. You should review side effects with your caregiver. If your blood pressure stays high after you have made lifestyle changes or started on medicines,   Your medication(s) may need to be changed.   Other problems may need to be addressed.   Be certain you understand your prescriptions, and know how and when to take your medicine.   Be sure to follow up with your caregiver within the time frame advised (usually within two  weeks) to have your blood pressure rechecked and to review your medications.   If you are taking more than one medicine to lower your blood pressure, make sure you know how and at what times they should be taken. Taking two medicines at the same time can result in blood pressure that is too low.  SEEK IMMEDIATE MEDICAL CARE IF:  You develop a severe headache, blurred or changing vision, or confusion.   You have unusual weakness or numbness, or a faint feeling.   You have severe chest or abdominal pain, vomiting, or breathing problems.  MAKE SURE YOU:   Understand these instructions.   Will watch your condition.   Will get help right away if you are not doing well or get worse.  Document Released: 08/09/2005 Document Revised: 07/29/2011 Document Reviewed: 03/29/2008 [facility] Patient Information 2012 [place], [place].

## 2011-10-28 NOTE — ED Provider Notes (Signed)
History     CSN: [id number]  Arrival date & time 10/28/11  1830   First MD Initiated Contact with Patient 10/28/11 1919      Chief Complaint  Patient presents with  . Emesis    (Consider location/radiation/quality/duration/timing/severity/associated sxs/prior treatment) Patient is a 46 y.o. male presenting with vomiting. The history is provided by the patient.  Emesis  Associated symptoms include cough and a fever. Pertinent negatives include no abdominal pain, no chills and no headaches.  pt with prod cough, congestion, in past 3-4 days. No sore throat or runny nose. Also notes intermittent nvd. Last emesis this morning, clear not bloody or bilious. Diarrhea a couple times per day, loose, not bloody. No abd pain. +body aches. Also notes hx chronic low back pain and states out of meds for same. No recent abrupt worsening or reinjury. No radicular pain. No numbness/weaknesss. subj fever, no chills/sweats. . No gu c/o.   Past Medical History  Diagnosis Date  . Anxiety   . Depression   . IFG (impaired fasting glucose)     Borderline T2DM  . Hyperlipidemia   . Obesity     Class III  . Hypertension   . Smoker   . Chronic back pain     History reviewed. No pertinent past surgical history.  Family History  Problem Relation Age of Onset  . Other Brother 37    AMI  . Depression Neg Hx   . Bipolar disorder Neg Hx     History  Substance Use Topics  . Smoking status: Current Everyday Smoker -- 0.5 packs/day for 17 years  . Smokeless tobacco: Not on file  . Alcohol Use: 5.0 oz/week    10 drink(s) per week     >10 a week      Review of Systems  Constitutional: Positive for fever. Negative for chills.  HENT: Negative for neck pain and neck stiffness.   Eyes: Negative for redness.  Respiratory: Positive for cough. Negative for shortness of breath.   Cardiovascular: Negative for chest pain and leg swelling.  Gastrointestinal: Positive for vomiting. Negative for abdominal  pain.  Genitourinary: Negative for dysuria.  Musculoskeletal: Positive for back pain.  Skin: Negative for rash.  Neurological: Negative for headaches.  Hematological: Does not bruise/bleed easily.  Psychiatric/Behavioral: Negative for confusion.    Allergies  Ace inhibitors and Nsaids  Home Medications   Current Outpatient Rx  Name Route Sig Dispense Refill  . ACETAMINOPHEN 500 MG PO TABS Oral Take 500 mg by mouth every 6 (six) hours as needed. For fever    . CETIRIZINE HCL 10 MG PO TABS Oral Take 10 mg by mouth daily as needed. For allergies    . FLUOXETINE HCL 20 MG PO CAPS Oral Take 20 mg by mouth daily.    . OXYCODONE-ACETAMINOPHEN 5-325 MG PO TABS Oral Take 1 tablet by mouth every 4 (four) hours as needed. For pain      BP 154/96  Pulse 103  Temp(Src) 97.9 F (36.6 C) (Oral)  Resp 20  Ht 5\' 5"  (1.651 m)  Wt 210 lb (95.255 kg)  BMI 34.95 kg/m2  SpO2 97%  Physical Exam  Nursing note and vitals reviewed. Constitutional: He is oriented to person, place, and time. He appears well-developed and well-nourished. No distress.  HENT:  Head: Atraumatic.  Nose: Nose normal.  Mouth/Throat: Oropharynx is clear and moist.  Eyes: Pupils are equal, round, and reactive to light.  Neck: Neck supple. No tracheal deviation present.  No stiffness or rigidity  Cardiovascular: Normal rate, regular rhythm, normal heart sounds and intact distal pulses.   Pulmonary/Chest: Effort normal. No accessory muscle usage. No respiratory distress.       Coughing, mild upper resp congestion.   Abdominal: Soft. Bowel sounds are normal. He exhibits no distension and no mass. There is no tenderness. There is no rebound and no guarding.  Genitourinary:       No cva tenderness  Musculoskeletal: Normal range of motion. He exhibits no edema and no tenderness.       Spine non tender. Lumbar muscular tenderness.  Neurological: He is alert and oriented to person, place, and time.       Motor intact bil.  Steady gait.   Skin: Skin is warm and dry.  Psychiatric: He has a normal mood and affect.    ED Course  Procedures (including critical care time)   Results for orders placed during the hospital encounter of 04/28/10  RAPID STREP SCREEN      Component Value Range   Streptococcus, Group A Screen (Direct) NEGATIVE  NEGATIVE    Dg Chest 2 View  10/28/2011  *RADIOLOGY REPORT*  Clinical Data: Fever and cough.  Back pain.  CHEST - 2 VIEW  Comparison: 07/28/2010  Findings: There is chronic peribronchial thickening with interstitial accentuation bilaterally with slight scarring at the left lung base.  No consolidative infiltrates or effusions.  Heart size and vascularity are normal.  Old healed left 9th rib fracture.  IMPRESSION: Chronic bronchitic changes.  Original Report Authenticated By: Gwynn Burly, M.D.     MDM  Zofran, po fluids.   No nvd in ed. Pt comfortable. Given prod cough, will rx bronchitis, pt notes hx same.      Suzi Roots, MD 10/28/11 2053

## 2011-10-28 NOTE — ED Notes (Signed)
Patient states he has been sick for the last 4 days with chills, nausea, vomiting, coughing and generalized body aches including a backache.  States he takes pain meds on a regular basis for chronic back pain and has been out of his meds for the last 4 days.

## 2011-10-29 ENCOUNTER — Ambulatory Visit: Payer: Self-pay | Admitting: Family Medicine

## 2011-10-29 DIAGNOSIS — Z0289 Encounter for other administrative examinations: Secondary | ICD-10-CM

## 2011-11-15 ENCOUNTER — Other Ambulatory Visit: Payer: Self-pay | Admitting: *Deleted

## 2011-11-15 ENCOUNTER — Other Ambulatory Visit: Payer: Self-pay | Admitting: Family Medicine

## 2011-11-15 MED ORDER — OXYCODONE-ACETAMINOPHEN 5-325 MG PO TABS: 1.0000 | ORAL_TABLET | ORAL | Status: DC | PRN

## 2011-11-15 MED ORDER — OXYCODONE-ACETAMINOPHEN 5-325 MG PO TABS: 1.0000 | ORAL_TABLET | Freq: Two times a day (BID) | ORAL | Status: DC

## 2011-12-02 ENCOUNTER — Ambulatory Visit (INDEPENDENT_AMBULATORY_CARE_PROVIDER_SITE_OTHER): Payer: Self-pay | Admitting: Family Medicine

## 2011-12-02 ENCOUNTER — Encounter: Payer: Self-pay | Admitting: Family Medicine

## 2011-12-02 VITALS — BP 143/96 | HR 91 | Ht 65.0 in | Wt 214.0 lb

## 2011-12-02 DIAGNOSIS — I10 Essential (primary) hypertension: Secondary | ICD-10-CM

## 2011-12-02 DIAGNOSIS — E785 Hyperlipidemia, unspecified: Secondary | ICD-10-CM

## 2011-12-02 DIAGNOSIS — F329 Major depressive disorder, single episode, unspecified: Secondary | ICD-10-CM

## 2011-12-02 DIAGNOSIS — R5383 Other fatigue: Secondary | ICD-10-CM

## 2011-12-02 MED ORDER — PAROXETINE HCL 20 MG PO TABS: 20.0000 mg | ORAL_TABLET | ORAL | Status: DC

## 2011-12-02 MED ORDER — LOSARTAN POTASSIUM 50 MG PO TABS: 50.0000 mg | ORAL_TABLET | Freq: Every day | ORAL | Status: DC

## 2011-12-02 MED ORDER — OXYCODONE-ACETAMINOPHEN 5-325 MG PO TABS: 1.0000 | ORAL_TABLET | Freq: Two times a day (BID) | ORAL | Status: DC

## 2011-12-02 NOTE — Patient Instructions (Signed)

## 2011-12-02 NOTE — Progress Notes (Signed)
  Subjective:    Patient ID: Gerald West, male    DOB: [birth year], 46 y.o.   MRN: [id number]  HPI Depression - Doing well overall but feels more tired on it and dec sex drive.  He has been working out and losing weight. Says he felt better on the paxil.    He feel chasing his puppy and hit is right leg. He says had to take some extra pain pills. Says he would like 15 extra tabs and can subract it from next months refills.   Hypertension-no chest pain or shortness of breath. He says he stopped his blood pressure pill. He is also overweight and felt like he didn't need it anymore. He also stopped his cholesterol pill.   Review of Systems     Objective:   Physical Exam  Constitutional: He is oriented to person, place, and time. He appears well-developed and well-nourished.  HENT:  Head: Normocephalic and atraumatic.  Cardiovascular: Normal rate, regular rhythm and normal heart sounds.   Pulmonary/Chest: Effort normal and breath sounds normal.  Neurological: He is alert and oriented to person, place, and time.  Skin: Skin is warm and dry.       He has a large abrasion on his right shin with significant swelling around it. No evidence of cellulitis or active drainage.  Psychiatric: He has a normal mood and affect. His behavior is normal.          Assessment & Plan:  Depression - will change to paxil per patient preference bc of fatigue on the prozac. We did discuss inc risk of weight gain. He says he plans to exercise.  Followup in 2 months a CT scan on the medication and to see if it needs to be adjusted or not.  PHQ-9 of 6.    Abrasion to rht right leg - will give him 10 extra pills and will subtract from next months  refills.   Hypertension-his blood pressure elevated today. We discussed that he really does need to be on blood pressure pill. He did have a cough with ACE inhibitor so we will try losartan 50 mg once daily. Followup in 8 weeks to recheck his blood pressure and make  sure he is tolerating it well.  Hyperlipidemia-recheck his lipid levels. He has lost some weight so hopefully will look better. Continue work on [employer], exercise and weight loss. We will call with the results.

## 2011-12-15 ENCOUNTER — Telehealth: Payer: Self-pay | Admitting: *Deleted

## 2011-12-15 NOTE — Telephone Encounter (Signed)
Pt called and wants a refill of his percocet. Not sure exactly how many he is supposed to get and we just rx;'ed this 4/11.please advise

## 2011-12-15 NOTE — Telephone Encounter (Signed)
OK to fill 50 tabs of the oxycodone.

## 2011-12-16 ENCOUNTER — Telehealth: Payer: Self-pay | Admitting: *Deleted

## 2011-12-16 MED ORDER — OXYCODONE-ACETAMINOPHEN 5-325 MG PO TABS: 1.0000 | ORAL_TABLET | Freq: Two times a day (BID) | ORAL | Status: DC

## 2011-12-16 NOTE — Telephone Encounter (Signed)
Refill oxy # 50 per Dr. Linford Arnold

## 2012-01-10 ENCOUNTER — Other Ambulatory Visit: Payer: Self-pay | Admitting: *Deleted

## 2012-01-10 NOTE — Telephone Encounter (Signed)
Pt calls and wants a refill on his percocet 5/325mg . Due on 25th which is Saturday. Only got #50 last refill and states that he normally gets 60 a month. Please advise

## 2012-01-10 NOTE — Telephone Encounter (Signed)
Ok to fill on Wed for #60 tabs.

## 2012-01-10 NOTE — Telephone Encounter (Signed)
Pt notified. KJ LPN 

## 2012-01-12 ENCOUNTER — Other Ambulatory Visit: Payer: Self-pay | Admitting: *Deleted

## 2012-01-12 MED ORDER — OXYCODONE-ACETAMINOPHEN 5-325 MG PO TABS: 1.0000 | ORAL_TABLET | Freq: Two times a day (BID) | ORAL | Status: DC

## 2012-02-04 ENCOUNTER — Ambulatory Visit: Payer: Self-pay | Admitting: Family Medicine

## 2012-02-09 ENCOUNTER — Other Ambulatory Visit: Payer: Self-pay | Admitting: Family Medicine

## 2012-02-11 ENCOUNTER — Other Ambulatory Visit: Payer: Self-pay | Admitting: *Deleted

## 2012-02-11 MED ORDER — OXYCODONE-ACETAMINOPHEN 5-325 MG PO TABS: 1.0000 | ORAL_TABLET | Freq: Two times a day (BID) | ORAL | Status: DC

## 2012-03-10 ENCOUNTER — Ambulatory Visit: Payer: Self-pay | Admitting: Family Medicine

## 2012-03-15 ENCOUNTER — Other Ambulatory Visit: Payer: Self-pay | Admitting: *Deleted

## 2012-03-15 MED ORDER — OXYCODONE-ACETAMINOPHEN 5-325 MG PO TABS: 1.0000 | ORAL_TABLET | Freq: Two times a day (BID) | ORAL | Status: DC

## 2012-03-24 ENCOUNTER — Ambulatory Visit: Payer: Self-pay | Admitting: Family Medicine

## 2012-04-06 ENCOUNTER — Encounter: Payer: Self-pay | Admitting: Family Medicine

## 2012-04-06 ENCOUNTER — Ambulatory Visit (INDEPENDENT_AMBULATORY_CARE_PROVIDER_SITE_OTHER): Payer: Self-pay | Admitting: Family Medicine

## 2012-04-06 VITALS — BP 151/95 | HR 86 | Temp 97.6°F | Wt 215.0 lb

## 2012-04-06 DIAGNOSIS — J029 Acute pharyngitis, unspecified: Secondary | ICD-10-CM

## 2012-04-06 DIAGNOSIS — J329 Chronic sinusitis, unspecified: Secondary | ICD-10-CM

## 2012-04-06 DIAGNOSIS — I10 Essential (primary) hypertension: Secondary | ICD-10-CM

## 2012-04-06 DIAGNOSIS — F329 Major depressive disorder, single episode, unspecified: Secondary | ICD-10-CM

## 2012-04-06 MED ORDER — OXYCODONE-ACETAMINOPHEN 5-325 MG PO TABS: 1.0000 | ORAL_TABLET | Freq: Two times a day (BID) | ORAL | Status: DC

## 2012-04-06 MED ORDER — AMOXICILLIN-POT CLAVULANATE 875-125 MG PO TABS: 1.0000 | ORAL_TABLET | Freq: Two times a day (BID) | ORAL | Status: AC

## 2012-04-06 MED ORDER — FLUOXETINE HCL (PMDD) 10 MG PO TABS: ORAL_TABLET | ORAL | Status: DC

## 2012-04-06 NOTE — Progress Notes (Signed)
  Subjective:    Patient ID: Gerald West, male    DOB: 1966-02-26, 46 y.o.   MRN: [id number]  HPI Cough and ST x 1 weeks. Says still feels achey all over. Still feeling nauseated. Cough is productive. Using mucinex - D and helping some. Had fever the first 2 days, none sine then.  Had some pressure in his face.  Says zpack doesn't work well for him.    Says feels irritable with paxil and wasn't to switch back to the prozac.     Review of Systems     Objective:   Physical Exam  Constitutional: He is oriented to person, place, and time. He appears well-developed and well-nourished.  HENT:  Head: Normocephalic and atraumatic.  Right Ear: External ear normal.  Left Ear: External ear normal.  Nose: Nose normal.  Mouth/Throat: Oropharynx is clear and moist.       TMs and canals are clear.  OP with mild erythema   Eyes: Conjunctivae and EOM are normal. Pupils are equal, round, and reactive to light.  Neck: Neck supple. No thyromegaly present.  Cardiovascular: Normal rate and normal heart sounds.   Pulmonary/Chest: Effort normal and breath sounds normal.  Lymphadenopathy:    He has no cervical adenopathy.  Neurological: He is alert and oriented to person, place, and time. He has normal reflexes.  Skin: Skin is warm and dry.  Psychiatric: He has a normal mood and affect.          Assessment & Plan:  Sinusitis - either viral or bacterial. Has been 8 days and has not done any better that he is not having any more fevers. We'll go ahead and put him on Augmentin for 10 days. Is not significantly better in one week then please call the office. His lung exam is actually clear today. He can continue the Mucinex as needed since it has been helpful. Make sure drinking plenty of fluids. I did encourage smoking cessation as well.  Depression - Will chage back  To prozac. F/U in 4 weeks.     Hypertension-uncontrolled. He thinks it's because he's not feeling well and has been sick in has been  taking some over-the-counter cold medicines. We will recheck his blood pressure in 4 weeks to make sure that it is at goal. Continue work on low fat diet and exercise.  Refill for extra 10 tabs or pain med. Has been havintg extra pain in his back with his coughing.

## 2012-04-06 NOTE — Patient Instructions (Addendum)
Cut paxil in half. Take half a tab daily for 7 days. Take half a tab every other day for 7 days. Start the prozac one tab daily for one week when get to taking the paxil every other day. Then after one week increase the prozac to 2 tabs daily.      Sinusitis Sinuses are air pockets within the bones of your face. The growth of bacteria within a sinus leads to infection. The infection prevents the sinuses from draining. This infection is called sinusitis. SYMPTOMS   There will be different areas of pain depending on which sinuses have become infected.  The maxillary sinuses often produce pain beneath the eyes.   Frontal sinusitis may cause pain in the middle of the forehead and above the eyes.  Other problems (symptoms) include:  Toothaches.   Colored, pus-like (purulent) drainage from the nose.   Swelling, warmth, and tenderness over the sinus areas may be signs of infection.  TREATMENT   Sinusitis is most often determined by an exam.X-rays may be taken. If x-rays have been taken, make sure you obtain your results or find out how you are to obtain them. Your caregiver may give you medications (antibiotics). These are medications that will help kill the bacteria causing the infection. You may also be given a medication (decongestant) that helps to reduce sinus swelling.   HOME CARE INSTRUCTIONS    Only take over-the-counter or prescription medicines for pain, discomfort, or fever as directed by your caregiver.   Drink extra fluids. Fluids help thin the mucus so your sinuses can drain more easily.   Applying either moist heat or ice packs to the sinus areas may help relieve discomfort.   Use saline nasal sprays to help moisten your sinuses. The sprays can be found at your local drugstore.  SEEK IMMEDIATE MEDICAL CARE IF:  You have a fever.   You have increasing pain, severe headaches, or toothache.   You have nausea, vomiting, or drowsiness.   You develop unusual swelling around  the face or trouble seeing.  MAKE SURE YOU:    Understand these instructions.   Will watch your condition.   Will get help right away if you are not doing well or get worse.  Document Released: 08/09/2005 Document Revised: 07/29/2011 Document Reviewed: 03/08/2007 [facility] Patient Information 2012 [place], [place].

## 2012-04-07 ENCOUNTER — Telehealth: Payer: Self-pay | Admitting: *Deleted

## 2012-04-07 MED ORDER — AMOXICILLIN 875 MG PO TABS: 875.0000 mg | ORAL_TABLET | Freq: Two times a day (BID) | ORAL | Status: DC

## 2012-04-07 NOTE — Telephone Encounter (Signed)
Pt called and states abx that was rx'ed yesterday is too expensive and pt does not have insur. Called in amox  875 per Dr. Linford Arnold.

## 2012-04-17 ENCOUNTER — Telehealth: Payer: Self-pay | Admitting: *Deleted

## 2012-04-17 MED ORDER — OXYCODONE-ACETAMINOPHEN 5-325 MG PO TABS: 1.0000 | ORAL_TABLET | Freq: Two times a day (BID) | ORAL | Status: DC | PRN

## 2012-04-17 NOTE — Telephone Encounter (Signed)
rx printed

## 2012-04-17 NOTE — Telephone Encounter (Signed)
Pt wants a refill of pain meds. He said this was discussed with you on the 8/15. He received 10 on the 8/15. And he normally gets 60 tabs

## 2012-04-18 ENCOUNTER — Ambulatory Visit (INDEPENDENT_AMBULATORY_CARE_PROVIDER_SITE_OTHER): Payer: Self-pay | Admitting: Family Medicine

## 2012-04-18 ENCOUNTER — Encounter: Payer: Self-pay | Admitting: Family Medicine

## 2012-04-18 VITALS — BP 152/102 | HR 95 | Wt 210.0 lb

## 2012-04-18 DIAGNOSIS — J329 Chronic sinusitis, unspecified: Secondary | ICD-10-CM

## 2012-04-18 MED ORDER — LEVOFLOXACIN 500 MG PO TABS: 500.0000 mg | ORAL_TABLET | Freq: Every day | ORAL | Status: DC

## 2012-04-18 MED ORDER — MOXIFLOXACIN HCL 400 MG PO TABS: 400.0000 mg | ORAL_TABLET | Freq: Every day | ORAL | Status: AC

## 2012-04-18 NOTE — Progress Notes (Signed)
  Subjective:    Patient ID: Gerald West, male    DOB: 04-09-1966, 45 y.o.   MRN: [id number]  HPI He is still not feeling well. He was seen for sinusitis. We put him originally on 10 day course of Augmentin. He says it was $60 cash Price and so called back adjacent to amoxicillin 875 mg twice a day. He finished a complete course. He said around day for he actually felt much better but by day 6 he was feeling worse again. He denies any fevers but does complain of some sweats and some chills. He has used Tylenol and Advil for this and has helped. He feels fatigued and still has a lot of severe nasal congestion with a lot of postnasal drip. He says that the drainage has turned from yellow to white color. He still has a productive cough. No shortness of breath. Occasional wheezing in his upper chest.   Review of Systems     Objective:   Physical Exam  Constitutional: He is oriented to person, place, and time. He appears well-developed and well-nourished.  HENT:  Head: Normocephalic and atraumatic.  Right Ear: External ear normal.  Left Ear: External ear normal.  Nose: Nose normal.  Mouth/Throat: Oropharynx is clear and moist.       TMs and canals are clear.   Eyes: Conjunctivae and EOM are normal. Pupils are equal, round, and reactive to light.  Neck: Neck supple. No thyromegaly present.  Cardiovascular: Normal rate and normal heart sounds.   Pulmonary/Chest: Effort normal and breath sounds normal.  Lymphadenopathy:    He has no cervical adenopathy.  Neurological: He is alert and oriented to person, place, and time.  Skin: Skin is warm and dry.  Psychiatric: He has a normal mood and affect.          Assessment & Plan:  Sinusitis-unresolved. Given samples of Avelox 4 mg daily. He is without insurance will be expensive to fill any type of fluoroquinolone. He says he take azithromycin the past but never seems to work well for him. The only other option would be to try Bactrim or  possibly doxycycline. If he is not better in one week then please call the office. Continue symptomatic care.

## 2012-04-18 NOTE — Patient Instructions (Addendum)
Call if not better in one week.  

## 2012-05-12 ENCOUNTER — Ambulatory Visit: Payer: Self-pay | Admitting: Family Medicine

## 2012-05-12 DIAGNOSIS — Z0289 Encounter for other administrative examinations: Secondary | ICD-10-CM

## 2012-05-17 ENCOUNTER — Other Ambulatory Visit: Payer: Self-pay | Admitting: *Deleted

## 2012-05-17 MED ORDER — OXYCODONE-ACETAMINOPHEN 5-325 MG PO TABS: 1.0000 | ORAL_TABLET | Freq: Two times a day (BID) | ORAL | Status: DC | PRN

## 2012-06-02 ENCOUNTER — Ambulatory Visit: Payer: Self-pay | Admitting: Family Medicine

## 2012-06-02 DIAGNOSIS — Z0289 Encounter for other administrative examinations: Secondary | ICD-10-CM

## 2012-06-14 ENCOUNTER — Other Ambulatory Visit: Payer: Self-pay | Admitting: Family Medicine

## 2012-06-16 ENCOUNTER — Other Ambulatory Visit: Payer: Self-pay | Admitting: *Deleted

## 2012-06-16 MED ORDER — OXYCODONE-ACETAMINOPHEN 5-325 MG PO TABS: 1.0000 | ORAL_TABLET | Freq: Two times a day (BID) | ORAL | Status: DC | PRN

## 2012-07-17 ENCOUNTER — Other Ambulatory Visit: Payer: Self-pay | Admitting: *Deleted

## 2012-07-17 MED ORDER — OXYCODONE-ACETAMINOPHEN 5-325 MG PO TABS: 1.0000 | ORAL_TABLET | Freq: Two times a day (BID) | ORAL | Status: DC | PRN

## 2012-08-11 ENCOUNTER — Encounter: Payer: Self-pay | Admitting: Family Medicine

## 2012-08-11 ENCOUNTER — Ambulatory Visit (INDEPENDENT_AMBULATORY_CARE_PROVIDER_SITE_OTHER): Payer: Self-pay | Admitting: Family Medicine

## 2012-08-11 VITALS — BP 142/90 | HR 88 | Ht 64.0 in | Wt 207.0 lb

## 2012-08-11 DIAGNOSIS — F329 Major depressive disorder, single episode, unspecified: Secondary | ICD-10-CM

## 2012-08-11 DIAGNOSIS — M545 Low back pain, unspecified: Secondary | ICD-10-CM

## 2012-08-11 DIAGNOSIS — I10 Essential (primary) hypertension: Secondary | ICD-10-CM

## 2012-08-11 DIAGNOSIS — F32A Depression, unspecified: Secondary | ICD-10-CM

## 2012-08-11 DIAGNOSIS — Z23 Encounter for immunization: Secondary | ICD-10-CM

## 2012-08-11 MED ORDER — LOSARTAN POTASSIUM 50 MG PO TABS: 50.0000 mg | ORAL_TABLET | Freq: Every day | ORAL | Status: DC

## 2012-08-11 MED ORDER — OXYCODONE-ACETAMINOPHEN 5-325 MG PO TABS: 1.0000 | ORAL_TABLET | Freq: Two times a day (BID) | ORAL | Status: DC | PRN

## 2012-08-11 MED ORDER — FLUOXETINE HCL 20 MG PO TABS: 20.0000 mg | ORAL_TABLET | Freq: Every day | ORAL | Status: DC

## 2012-08-11 NOTE — Progress Notes (Signed)
  Subjective:    Patient ID: Gerald West, male    DOB: [birth year], 46 y.o.   MRN: [id number]  HPI Depression - Well controlled.  SLepeing well. Wants to refill on fluoxetine.  Tolerating medication well without any side effects.  Wants flu shot.   HTN - out of his meds.  Pt denies chest pain, SOB, dizziness, or heart palpitations.  Taking meds as directed w/o problems.  Denies medication side effects.    Lumbago-he would like a refill on his pain medications.  Review of Systems     Objective:   Physical Exam  Constitutional: He is oriented to person, place, and time. He appears well-developed and well-nourished.  HENT:  Head: Normocephalic and atraumatic.  Cardiovascular: Normal rate, regular rhythm and normal heart sounds.   Pulmonary/Chest: Effort normal and breath sounds normal.  Neurological: He is alert and oriented to person, place, and time.  Skin: Skin is warm and dry.  Psychiatric: He has a normal mood and affect. His behavior is normal.          Assessment & Plan:  Depression - well-controlled. Continue current regimen. He's been taking 2 tablets of the 10 mg. Will change to a 20 mg tab. Followup in 6 months.  HTN- uncontrolled. strongly encouraged him to get back on his blood pressure medication. will refill losartan.  F/U in 4 months.  I gave him a lab slip to get the lab in 2-3 weeks to check a CMP and fasting lipid panel.  Lumbago. Wants refill on meds today since office cosed next week. Prescription presented and given today.  Flu vaccine given today. He wants to check on when his last tetanus was. He thinks he may have had it about 2 years ago but is not sure.

## 2012-08-11 NOTE — Patient Instructions (Addendum)
Start a regular exercise routine.  Smoking Cessation, Tips for Success YOU CAN QUIT SMOKING If you are ready to quit smoking, congratulations! You have chosen to help yourself be healthier. Cigarettes bring nicotine, tar, carbon monoxide, and other irritants into your body. Your lungs, heart, and blood vessels will be able to work better without these poisons. There are many different ways to quit smoking. Nicotine gum, nicotine patches, a nicotine inhaler, or nicotine nasal spray can help with physical craving. Hypnosis, support groups, and medicines help break the habit of smoking. Here are some tips to help you quit for good.  Throw away all cigarettes.   Clean and remove all ashtrays from your home, work, and car.   On a card, write down your reasons for quitting. Carry the card with you and read it when you get the urge to smoke.   Cleanse your body of nicotine. Drink enough water and fluids to keep your urine clear or pale yellow. Do this after quitting to flush the nicotine from your body.   Learn to predict your moods. Do not let a bad situation be your excuse to have a cigarette. Some situations in your life might tempt you into wanting a cigarette.   Never have "just one" cigarette. It leads to wanting another and another. Remind yourself of your decision to quit.   Change habits associated with smoking. If you smoked while driving or when feeling stressed, try other activities to replace smoking. Stand up when drinking your coffee. Brush your teeth after eating. Sit in a different chair when you read the paper. Avoid alcohol while trying to quit, and try to drink fewer caffeinated beverages. Alcohol and caffeine may urge you to smoke.   Avoid foods and drinks that can trigger a desire to smoke, such as sugary or spicy foods and alcohol.   Ask people who smoke not to smoke around you.   Have something planned to do right after eating or having a cup of coffee. Take a walk or  exercise to perk you up. This will help to keep you from overeating.   Try a relaxation exercise to calm you down and decrease your stress. Remember, you may be tense and nervous for the first 2 weeks after you quit, but this will pass.   Find new activities to keep your hands busy. Play with a pen, coin, or rubber band. Doodle or draw things on paper.   Brush your teeth right after eating. This will help cut down on the craving for the taste of tobacco after meals. You can try mouthwash, too.   Use oral substitutes, such as lemon drops, carrots, a cinnamon stick, or chewing gum, in place of cigarettes. Keep them handy so they are available when you have the urge to smoke.   When you have the urge to smoke, try deep breathing.   Designate your home as a nonsmoking area.   If you are a heavy smoker, ask your caregiver about a prescription for nicotine chewing gum. It can ease your withdrawal from nicotine.   Reward yourself. Set aside the cigarette money you save and buy yourself something nice.   Look for support from others. Join a support group or smoking cessation program. Ask someone at home or at work to help you with your plan to quit smoking.   Always ask yourself, "Do I need this cigarette or is this just a reflex?" Tell yourself, "Today, I choose not to smoke," or "I do  not want to smoke." You are reminding yourself of your decision to quit, even if you do smoke a cigarette.  HOW WILL I FEEL WHEN I QUIT SMOKING?  The benefits of not smoking start within days of quitting.   You may have symptoms of withdrawal because your body is used to nicotine (the addictive substance in cigarettes). You may crave cigarettes, be irritable, feel very hungry, cough often, get headaches, or have difficulty concentrating.   The withdrawal symptoms are only temporary. They are strongest when you first quit but will go away within 10 to 14 days.   When withdrawal symptoms occur, stay in control.  Think about your reasons for quitting. Remind yourself that these are signs that your body is healing and getting used to being without cigarettes.   Remember that withdrawal symptoms are easier to treat than the major diseases that smoking can cause.   Even after the withdrawal is over, expect periodic urges to smoke. However, these cravings are generally short-lived and will go away whether you smoke or not. Do not smoke!   If you relapse and smoke again, do not lose hope. Most smokers quit 3 times before they are successful.   If you relapse, do not give up! Plan ahead and think about what you will do the next time you get the urge to smoke.  LIFE AS A NONSMOKER: MAKE IT FOR A MONTH, MAKE IT FOR LIFE Day 1: Hang this page where you will see it every day. Day 2: Get rid of all ashtrays, matches, and lighters. Day 3: Drink water. Breathe deeply between sips. Day 4: Avoid places with smoke-filled air, such as bars, clubs, or the smoking section of restaurants. Day 5: Keep track of how much money you save by not smoking. Day 6: Avoid boredom. Keep a good book with you or go to the movies. Day 7: Reward yourself! One week without smoking! Day 8: Make a dental appointment to get your teeth cleaned. Day 9: Decide how you will turn down a cigarette before it is offered to you. Day 10: Review your reasons for quitting. Day 11: Distract yourself. Stay active to keep your mind off smoking and to relieve tension. Take a walk, exercise, read a book, do a crossword puzzle, or try a new hobby. Day 12: Exercise. Get off the bus before your stop or use stairs instead of escalators. Day 13: Call on friends for support and encouragement. Day 14: Reward yourself! Two weeks without smoking! Day 15: Practice deep breathing exercises. Day 16: Bet a friend that you can stay a nonsmoker. Day 17: Ask to sit in nonsmoking sections of restaurants. Day 18: Hang up "No Smoking" signs. Day 19: Think of yourself as a  nonsmoker. Day 20: Each morning, tell yourself you will not smoke. Day 21: Reward yourself! Three weeks without smoking! Day 22: Think of smoking in negative ways. Remember how it stains your teeth, gives you bad breath, and leaves you short of breath. Day 23: Eat a nutritious breakfast. Day 24:Do not relive your days as a smoker. Day 25: Hold a pencil in your hand when talking on the telephone. Day 26: Tell all your friends you do not smoke. Day 27: Think about how much better food tastes. Day 28: Remember, one cigarette is one too many. Day 29: Take up a hobby that will keep your hands busy. Day 30: Congratulations! One month without smoking! Give yourself a big reward. Your caregiver can direct you to community  resources or hospitals for support, which may include:  Group support.   Education.   Hypnosis.   Subliminal therapy.  Document Released: 05/07/2004 Document Revised: 11/01/2011 Document Reviewed: 05/26/2009 [facility] Patient Information 2013 [place], [place].

## 2012-09-14 ENCOUNTER — Other Ambulatory Visit: Payer: Self-pay

## 2012-09-14 MED ORDER — OXYCODONE-ACETAMINOPHEN 5-325 MG PO TABS: 1.0000 | ORAL_TABLET | Freq: Two times a day (BID) | ORAL | Status: DC | PRN

## 2012-10-06 ENCOUNTER — Ambulatory Visit: Payer: Self-pay | Admitting: Family Medicine

## 2012-10-16 ENCOUNTER — Other Ambulatory Visit: Payer: Self-pay | Admitting: *Deleted

## 2012-10-16 MED ORDER — OXYCODONE-ACETAMINOPHEN 5-325 MG PO TABS: 1.0000 | ORAL_TABLET | Freq: Two times a day (BID) | ORAL | Status: DC | PRN

## 2012-10-16 NOTE — Telephone Encounter (Signed)
pt informed that he will have to make an appt in order to obtain more refills. he will do that tomorrow when he comes in to p/u his Rx.Gerald West

## 2012-11-03 ENCOUNTER — Ambulatory Visit: Payer: Self-pay | Admitting: Family Medicine

## 2012-11-10 ENCOUNTER — Encounter: Payer: Self-pay | Admitting: Family Medicine

## 2012-11-10 ENCOUNTER — Ambulatory Visit (INDEPENDENT_AMBULATORY_CARE_PROVIDER_SITE_OTHER): Payer: Self-pay | Admitting: Family Medicine

## 2012-11-10 VITALS — BP 159/97 | HR 94 | Ht 64.0 in | Wt 210.0 lb

## 2012-11-10 DIAGNOSIS — R7301 Impaired fasting glucose: Secondary | ICD-10-CM

## 2012-11-10 DIAGNOSIS — I10 Essential (primary) hypertension: Secondary | ICD-10-CM

## 2012-11-10 LAB — POCT GLYCOSYLATED HEMOGLOBIN (HGB A1C): Hemoglobin A1C: 5.7

## 2012-11-10 MED ORDER — OXYCODONE-ACETAMINOPHEN 5-325 MG PO TABS: 1.0000 | ORAL_TABLET | Freq: Two times a day (BID) | ORAL | Status: DC | PRN

## 2012-11-10 NOTE — Progress Notes (Signed)
  Subjective:    Patient ID: Gerald West, male    DOB: 1965-09-20, 47 y.o.   MRN: [id number]  HPI HTN-  Pt denies chest pain, SOB, dizziness, or heart palpitations. He has not picked up the medication.  He has also not gone for his blood work. He says he meant to put in his car broke down into 7 $2000 to fix it and so didn't make it. He also is concerned of losartan will be expensive as he is a cash pay patient. It is generic but it certainly could still be a little privacy.  Impaired fasting glucose-his last A1c was done in 2011 and was 6.1 at that time. He does have some family history of diabetes and he has noticed some sweats at night recently.  Review of Systems     Objective:   Physical Exam  Constitutional: He is oriented to person, place, and time. He appears well-developed and well-nourished.  HENT:  Head: Normocephalic and atraumatic.  Cardiovascular: Normal rate, regular rhythm and normal heart sounds.   Pulmonary/Chest: Effort normal and breath sounds normal.  Neurological: He is alert and oriented to person, place, and time.  Skin: Skin is warm and dry.  Psychiatric: He has a normal mood and affect. His behavior is normal.          Assessment & Plan:  HTN - Uncontrolled. Encouraged him to try to fill the prescription for the losartan. Another option is that we can increase the dose 200 mg and he can split them and that will help save money as well. In the interim I did give him samples of Benicar 20 mg to take once a day until he picked up the prescription. Let us see him back in 6 weeks to make sure that he is well-controlled and his blood pressures optimized. At some point really would like for him to go for labwork. Unknown history: Because he doesn't have health insurance right now.  Impaired fasting glucose-his hemoglobin A1c was down to 5.7 today which is fantastic. Still encouraged him to make sure avoiding concentrated sweets and really watch his portion sizes on  his carbohydrates and encouraged regular exercise. Recommend recheck A1c in 1 year.

## 2012-11-10 NOTE — Patient Instructions (Signed)
Please try the Benicar samples. 20 mg once a day at least until you can pick up the prescription at the pharmacy. If the losartan is still fairly extensive please let me know and we can increase the dose to 100 mg and you can split them and that should hopefully help with the cost. If it's still cost prohibitive then please let me know and we can consider changing to a completely different medication.

## 2012-11-24 ENCOUNTER — Encounter: Payer: Self-pay | Admitting: Gastroenterology

## 2012-12-14 ENCOUNTER — Other Ambulatory Visit: Payer: Self-pay | Admitting: *Deleted

## 2012-12-14 MED ORDER — OXYCODONE-ACETAMINOPHEN 5-325 MG PO TABS: 1.0000 | ORAL_TABLET | Freq: Two times a day (BID) | ORAL | Status: DC | PRN

## 2012-12-15 ENCOUNTER — Encounter: Payer: Self-pay | Admitting: Gastroenterology

## 2012-12-15 ENCOUNTER — Ambulatory Visit (INDEPENDENT_AMBULATORY_CARE_PROVIDER_SITE_OTHER): Payer: BC Managed Care – PPO | Admitting: Gastroenterology

## 2012-12-15 VITALS — BP 130/80 | HR 120 | Ht 63.0 in | Wt 204.4 lb

## 2012-12-15 DIAGNOSIS — N509 Disorder of male genital organs, unspecified: Secondary | ICD-10-CM

## 2012-12-15 DIAGNOSIS — R195 Other fecal abnormalities: Secondary | ICD-10-CM

## 2012-12-15 DIAGNOSIS — K921 Melena: Secondary | ICD-10-CM

## 2012-12-15 DIAGNOSIS — N50812 Left testicular pain: Secondary | ICD-10-CM

## 2012-12-15 MED ORDER — MOVIPREP 100 G PO SOLR: 1.0000 | Freq: Once | ORAL | Status: DC

## 2012-12-15 NOTE — Progress Notes (Signed)
History of Present Illness: This is a 47 year old male who complains of frequent bright red rectal bleeding, rectal swelling and rectal itching. His symptoms have been present for about 4-5 years. He's used over-the-counter hemorrhoidal treatments with intermittent success. He relates recurrent left testicular pain and left groin pain for the past 2 years. He takes Prilosec on a regular basis to control reflux symptoms at the present for approximately 5 years. Denies weight loss, abdominal pain, constipation, diarrhea, change in stool caliber, melena, nausea, vomiting, dysphagia, chest pain.  Review of Systems: Pertinent positive and negative review of systems were noted in the above HPI section. All other review of systems were otherwise negative.  Current Medications, Allergies, Past Medical History, Past Surgical History, Family History and Social History were reviewed in [employer] record.  Physical Exam: General: Well developed , well nourished, no acute distress Head: Normocephalic and atraumatic Eyes:  sclerae anicteric, EOMI Ears: Normal auditory acuity Mouth: No deformity or lesions Neck: Supple, no masses or thyromegaly Lungs: Clear throughout to auscultation Heart: Regular rate and rhythm; no murmurs, rubs or bruits Abdomen: Soft, large, non tender and non distended. No masses, hepatosplenomegaly noted. Small umbilical hernia easily reducible and diastases rectus. Normal Bowel sounds Rectal: Small external hemorrhoidal tag no internal lesions Hemoccult positive brown stool the vault Musculoskeletal: Symmetrical with no gross deformities  Skin: No lesions on visible extremities Pulses:  Normal pulses noted Extremities: No clubbing, cyanosis, edema or deformities noted Neurological: Alert oriented x 4, grossly nonfocal Cervical Nodes:  No significant cervical adenopathy Inguinal Nodes: No significant inguinal adenopathy Psychological:  Alert and cooperative.  Normal mood and affect  Assessment and Recommendations:  1. Hematochezia and Hemoccult-positive stool. This is likely a benign anorectal source however colorectal neoplasms and other disorders need to be excluded. Schedule colonoscopy. The risks, benefits, and alternatives to colonoscopy with possible biopsy and possible polypectomy were discussed with the patient and they consent to proceed.   2. GERD. Standard antireflux measures and Prilosec 20 mg every morning as needed.  3. Left testicular and left groin pain. Urology referral.

## 2012-12-15 NOTE — Patient Instructions (Addendum)
You have been scheduled for a colonoscopy with propofol. Please follow written instructions given to you at your visit today.  Please pick up your prep kit at the pharmacy within the next 1-3 days. If you use inhalers (even only as needed), please bring them with you on the day of your procedure. Your physician has requested that you go to www.startemmi.com and enter the access code given to you at your visit today. This web site gives a general overview about your procedure. However, you should still follow specific instructions given to you by our office regarding your preparation for the procedure. We have sent a referral to the Urology Clinic, if you do not hear from that office next week call our office and we will get a status update. CC:  Nani Gasser MD

## 2012-12-20 ENCOUNTER — Encounter: Payer: Self-pay | Admitting: Gastroenterology

## 2012-12-20 ENCOUNTER — Ambulatory Visit (AMBULATORY_SURGERY_CENTER): Payer: BC Managed Care – PPO | Admitting: Gastroenterology

## 2012-12-20 VITALS — BP 145/94 | HR 66 | Temp 97.8°F | Resp 15 | Ht 63.0 in | Wt 204.0 lb

## 2012-12-20 DIAGNOSIS — D126 Benign neoplasm of colon, unspecified: Secondary | ICD-10-CM

## 2012-12-20 DIAGNOSIS — K921 Melena: Secondary | ICD-10-CM

## 2012-12-20 DIAGNOSIS — R195 Other fecal abnormalities: Secondary | ICD-10-CM

## 2012-12-20 MED ORDER — SODIUM CHLORIDE 0.9 % IV SOLN: 500.0000 mL | INTRAVENOUS | Status: DC

## 2012-12-20 NOTE — Patient Instructions (Addendum)
YOU HAD AN ENDOSCOPIC PROCEDURE TODAY AT THE [facility]: Refer to the procedure report that was given to you for any specific questions about what was found during the examination.  If the procedure report does not answer your questions, please call your gastroenterologist to clarify.  If you requested that your care partner not be given the details of your procedure findings, then the procedure report has been included in a sealed envelope for you to review at your convenience later.  YOU SHOULD EXPECT: Some feelings of bloating in the abdomen. Passage of more gas than usual.  Walking can help get rid of the air that was put into your GI tract during the procedure and reduce the bloating. If you had a lower endoscopy (such as a colonoscopy or flexible sigmoidoscopy) you may notice spotting of blood in your stool or on the toilet paper. If you underwent a bowel prep for your procedure, then you may not have a normal bowel movement for a few days. USE PREPARATION H FOR YOUR HEMORRHOIDS.  DIET: Your first meal following the procedure should be a light meal and then it is ok to progress to your normal diet.  A half-sandwich or bowl of soup is an example of a good first meal.  Heavy or fried foods are harder to digest and may make you feel nauseous or bloated.  Likewise meals heavy in dairy and vegetables can cause extra gas to form and this can also increase the bloating.  Drink plenty of fluids but you should avoid alcoholic beverages for 24 hours.  ACTIVITY: Your care partner should take you home directly after the procedure.  You should plan to take it easy, moving slowly for the rest of the day.  You can resume normal activity the day after the procedure however you should NOT DRIVE or use heavy machinery for 24 hours (because of the sedation medicines used during the test).    SYMPTOMS TO REPORT IMMEDIATELY: A gastroenterologist can be reached at any hour.  During normal business hours, 8:30  AM to 5:00 PM Monday through Friday, call [phone].  After hours and on weekends, please call the GI answering service at [phone] who will take a message and have the physician on call contact you.   Following lower endoscopy (colonoscopy or flexible sigmoidoscopy):  Excessive amounts of blood in the stool  Significant tenderness or worsening of abdominal pains  Swelling of the abdomen that is new, acute  Fever of 100F or higher  FOLLOW UP: If any biopsies were taken you will be contacted by phone or by letter within the next 1-3 weeks.  Call your gastroenterologist if you have not heard about the biopsies in 3 weeks.  Our staff will call the home number listed on your records the next business day following your procedure to check on you and address any questions or concerns that you may have at that time regarding the information given to you following your procedure. This is a courtesy call and so if there is no answer at the home number and we have not heard from you through the emergency physician on call, we will assume that you have returned to your regular daily activities without incident.  SIGNATURES/CONFIDENTIALITY: You and/or your care partner have signed paperwork which will be entered into your electronic medical record.  These signatures attest to the fact that that the information above on your After Visit Summary has been reviewed and is understood.  Full  responsibility of the confidentiality of this discharge information lies with you and/or your care-partner.

## 2012-12-20 NOTE — Progress Notes (Signed)
Patient did not have preoperative order for IV antibiotic SSI prophylaxis. (G8918)  Patient did not experience any of the following events: a burn prior to discharge; a fall within the facility; wrong site/side/patient/procedure/implant event; or a hospital transfer or hospital admission upon discharge from the facility. (G8907)  

## 2012-12-20 NOTE — Progress Notes (Signed)
Called to room to assist during endoscopic procedure.  Patient ID and intended procedure confirmed with present staff. Received instructions for my participation in the procedure from the performing physician.  

## 2012-12-20 NOTE — Op Note (Signed)
[facility] [address] [place], [address]   COLONOSCOPY PROCEDURE REPORT  PATIENT: Gerald West, Gerald West  MR#: [id number] BIRTHDATE: [birth year] , 46  yrs. old GENDER: Male ENDOSCOPIST: Meryl Dare, MD, [facility] REFERRED [id number] Linford Arnold, M.D. PROCEDURE DATE:  12/20/2012 PROCEDURE:   Colonoscopy with snare polypectomy ASA CLASS:   Class II INDICATIONS:hematochezia and heme-positive stool. MEDICATIONS: MAC sedation, administered by CRNA and propofol (Diprivan) 300mg  IV DESCRIPTION OF PROCEDURE:   After the risks benefits and alternatives of the procedure were thoroughly explained, informed consent was obtained.  A digital rectal exam revealed external hemorrhoids.   The LB [id number]  endoscope was introduced through the anus and advanced to the cecum, which was identified by both the appendix and ileocecal valve. No adverse events experienced.   The quality of the prep was good, using MoviPrep The instrument was then slowly withdrawn as the colon was fully examined.  COLON FINDINGS: Two sessile polyps measuring 5 mm in size were found in the ascending colon and at the hepatic flexure.  A polypectomy was performed with a cold snare.  The resection was complete and the polyp tissue was completely retrieved.   The colon was otherwise normal.  There was no diverticulosis, inflammation, polyps or cancers unless previously stated.  Retroflexed views revealed internal hemorrhoids. The time to cecum=1 minutes 30 seconds.  Withdrawal time=10 minutes 47 seconds.  The scope was withdrawn and the procedure completed.  COMPLICATIONS: There were no complications.  ENDOSCOPIC IMPRESSION: 1.   Two sessile polyps measuring 5 mm in ascending colon and hepatic flexure; polypectomy performed with a cold snare 2.   Small internal hemorrhoids  RECOMMENDATIONS: 1.  Await pathology results 2.  Repeat colonoscopy in 5 years if polyp adenomatous; otherwise 10 years 3.   Prep H supp daily for hemorrhoidal symptoms  eSigned:  Meryl Dare, MD, [facility] 12/20/2012 3:32 PM

## 2012-12-21 ENCOUNTER — Telehealth: Payer: Self-pay | Admitting: *Deleted

## 2012-12-21 NOTE — Telephone Encounter (Signed)
  Follow up Call-  Call back number 12/20/2012  Post procedure Call Back phone  # [phone] or wifes # [phone]  Permission to leave phone message Yes     Left message, no answer on his cell#.

## 2012-12-22 ENCOUNTER — Ambulatory Visit: Payer: Self-pay | Admitting: Family Medicine

## 2012-12-25 ENCOUNTER — Encounter: Payer: Self-pay | Admitting: Gastroenterology

## 2012-12-29 ENCOUNTER — Ambulatory Visit: Payer: Self-pay | Admitting: Family Medicine

## 2013-01-12 ENCOUNTER — Encounter: Payer: Self-pay | Admitting: Family Medicine

## 2013-01-12 ENCOUNTER — Ambulatory Visit (INDEPENDENT_AMBULATORY_CARE_PROVIDER_SITE_OTHER): Payer: BC Managed Care – PPO | Admitting: Family Medicine

## 2013-01-12 VITALS — BP 140/94 | HR 92 | Wt 206.0 lb

## 2013-01-12 DIAGNOSIS — Z23 Encounter for immunization: Secondary | ICD-10-CM

## 2013-01-12 DIAGNOSIS — H00019 Hordeolum externum unspecified eye, unspecified eyelid: Secondary | ICD-10-CM

## 2013-01-12 DIAGNOSIS — G8929 Other chronic pain: Secondary | ICD-10-CM

## 2013-01-12 DIAGNOSIS — I10 Essential (primary) hypertension: Secondary | ICD-10-CM

## 2013-01-12 DIAGNOSIS — H00016 Hordeolum externum left eye, unspecified eyelid: Secondary | ICD-10-CM

## 2013-01-12 DIAGNOSIS — M545 Low back pain: Secondary | ICD-10-CM

## 2013-01-12 MED ORDER — LOSARTAN POTASSIUM 100 MG PO TABS: 100.0000 mg | ORAL_TABLET | Freq: Every day | ORAL | Status: DC

## 2013-01-12 MED ORDER — ERYTHROMYCIN 5 MG/GM OP OINT: TOPICAL_OINTMENT | Freq: Four times a day (QID) | OPHTHALMIC | Status: DC

## 2013-01-12 MED ORDER — OXYCODONE-ACETAMINOPHEN 5-325 MG PO TABS: 1.0000 | ORAL_TABLET | Freq: Two times a day (BID) | ORAL | Status: DC | PRN

## 2013-01-12 NOTE — Patient Instructions (Signed)
Sty A sty (hordeolum) is an infection of a gland in the eyelid located at the base of the eyelash. A sty may develop a white or yellow head of pus. It can be puffy (swollen). Usually, the sty will burst and pus will come out on its own. They do not leave lumps in the eyelid once they drain. A sty is often confused with another form of cyst of the eyelid called a chalazion. Chalazions occur within the eyelid and not on the edge where the bases of the eyelashes are. They often are red, sore and then form firm lumps in the eyelid. CAUSES   Germs (bacteria).  Lasting (chronic) eyelid inflammation. SYMPTOMS   Tenderness, redness and swelling along the edge of the eyelid at the base of the eyelashes.  Sometimes, there is a white or yellow head of pus. It may or may not drain. DIAGNOSIS  An ophthalmologist will be able to distinguish between a sty and a chalazion and treat the condition appropriately.  TREATMENT   Styes are typically treated with warm packs (compresses) until drainage occurs.  In rare cases, medicines that kill germs (antibiotics) may be prescribed. These antibiotics may be in the form of drops, cream or pills.  If a hard lump has formed, it is generally necessary to do a small incision and remove the hardened contents of the cyst in a minor surgical procedure done in the office.  In suspicious cases, your caregiver may send the contents of the cyst to the lab to be certain that it is not a rare, but dangerous form of cancer of the glands of the eyelid. HOME CARE INSTRUCTIONS   Wash your hands often and dry them with a clean towel. Avoid touching your eyelid. This may spread the infection to other parts of the eye.  Apply heat to your eyelid for 10 to 20 minutes, several times a day, to ease pain and help to heal it faster.  Do not squeeze the sty. Allow it to drain on its own. Wash your eyelid carefully 3 to 4 times per day to remove any pus. SEEK IMMEDIATE MEDICAL CARE IF:    Your eye becomes painful or puffy (swollen).  Your vision changes.  Your sty does not drain by itself within 3 days.  Your sty comes back within a short period of time, even with treatment.  You have redness (inflammation) around the eye.  You have a fever. Document Released: 05/19/2005 Document Revised: 11/01/2011 Document Reviewed: 01/21/2009 [employer] Patient Information 2014 [employer].  

## 2013-01-12 NOTE — Progress Notes (Signed)
  Subjective:    Patient ID: Gerald West, male    DOB: 05/28/1966, 47 y.o.   MRN: [id number]  HPI HTN -  Pt denies chest pain, SOB, dizziness, or heart palpitations.  Taking meds as directed w/o problems.  Denies medication side effects.  Says has taking meds daily for 3 weeks.   Went for colonoscpoy about 2 weeks ago  Stye on left lower eye.  Says he had a hair on his eyelid that he pulled. He says he does get these from time to time. They typically resolve on her own. He's not been using any warm compresses. Has been using some over-the-counter eyedrops for it to get relief because he says it feels irritated. No vision change.  Chronic low back pain-he would like a refill on his pain medication today. Also he says now he has insurance he would like to see a specialist for his back has been getting progressively worse. He has not had x-rays in probably several years.   Review of Systems     Objective:   Physical Exam  Constitutional: He is oriented to person, place, and time. He appears well-developed and well-nourished.  HENT:  Head: Normocephalic and atraumatic.  Stye along lower mid eyelid on left eye.  Mild erythema nd edema of the lower lid.   Cardiovascular: Normal rate, regular rhythm and normal heart sounds.   Pulmonary/Chest: Effort normal and breath sounds normal.  Neurological: He is alert and oriented to person, place, and time.  Skin: Skin is warm and dry.  Psychiatric: He has a normal mood and affect. His behavior is normal.          Assessment & Plan:  HTN - uncontrolled. We will increase his losartan 200 mg. Followup in 6 weeks to recheck blood pressure.  Stye - discussed warm compresses and treat with topical antibiotic ointment.  If not improving in one week then please let me know and we'll refer to ophthalmology for further evaluation and treatment.  Chronic low back pain-will increase get plain film x-rays today. Did refill his pain medication. Will refer  to orthopedist in [place] per patient preference.

## 2013-02-12 ENCOUNTER — Other Ambulatory Visit: Payer: Self-pay | Admitting: *Deleted

## 2013-02-12 NOTE — Telephone Encounter (Signed)
Pt called to request med refill. lvm for return call.Gerald West [place]

## 2013-02-13 MED ORDER — OXYCODONE-ACETAMINOPHEN 5-325 MG PO TABS: 1.0000 | ORAL_TABLET | Freq: Two times a day (BID) | ORAL | Status: DC | PRN

## 2013-02-13 NOTE — Telephone Encounter (Signed)
Pt coming by to p/u rx before lunch.Gerald West Pacas [place]

## 2013-03-02 ENCOUNTER — Ambulatory Visit: Payer: BC Managed Care – PPO | Admitting: Family Medicine

## 2013-03-02 DIAGNOSIS — Z0289 Encounter for other administrative examinations: Secondary | ICD-10-CM

## 2013-03-14 ENCOUNTER — Other Ambulatory Visit: Payer: Self-pay | Admitting: Family Medicine

## 2013-03-14 MED ORDER — OXYCODONE-ACETAMINOPHEN 5-325 MG PO TABS: 1.0000 | ORAL_TABLET | Freq: Two times a day (BID) | ORAL | Status: DC | PRN

## 2013-04-12 ENCOUNTER — Telehealth: Payer: Self-pay | Admitting: Family Medicine

## 2013-04-12 MED ORDER — OXYCODONE-ACETAMINOPHEN 5-325 MG PO TABS: 1.0000 | ORAL_TABLET | Freq: Two times a day (BID) | ORAL | Status: DC | PRN

## 2013-04-12 NOTE — Telephone Encounter (Signed)
Ok to fill 

## 2013-04-12 NOTE — Telephone Encounter (Signed)
Pt's Percocet 325 runs out on Saturday 04-14-13 so he wants to pick up med on Friday 04-13-13... Please let me know if this is okay because he states he will call me back around 3 today? Patient also wanted me to let Dr. Linford Arnold he will have insurance again in about 3-4 weeks and at that time he will be able to afford Image to be completed. Thanks, [employer]

## 2013-04-12 NOTE — Telephone Encounter (Signed)
rx placed up front.Gerald West

## 2013-05-14 ENCOUNTER — Telehealth: Payer: Self-pay | Admitting: Family Medicine

## 2013-05-14 MED ORDER — OXYCODONE-ACETAMINOPHEN 5-325 MG PO TABS: 1.0000 | ORAL_TABLET | Freq: Two times a day (BID) | ORAL | Status: DC | PRN

## 2013-05-14 NOTE — Telephone Encounter (Signed)
Pt would like to pick up his Percocet tomorrow 05-15-13 around 2:00 and is just calling ahead of time as instructed to do so. Thanks, [employer]

## 2013-05-14 NOTE — Telephone Encounter (Signed)
Ok to pick up tomorrow

## 2013-05-18 ENCOUNTER — Other Ambulatory Visit: Payer: Self-pay | Admitting: Family Medicine

## 2013-06-13 ENCOUNTER — Encounter: Payer: Self-pay | Admitting: *Deleted

## 2013-06-13 ENCOUNTER — Telehealth: Payer: Self-pay | Admitting: Family Medicine

## 2013-06-13 DIAGNOSIS — Z79899 Other long term (current) drug therapy: Secondary | ICD-10-CM

## 2013-06-13 DIAGNOSIS — G8929 Other chronic pain: Secondary | ICD-10-CM

## 2013-06-13 MED ORDER — OXYCODONE-ACETAMINOPHEN 5-325 MG PO TABS: 1.0000 | ORAL_TABLET | Freq: Two times a day (BID) | ORAL | Status: DC | PRN

## 2013-06-13 NOTE — Telephone Encounter (Signed)
Okay to fill medication. He also needs to sign an up-to-date pain contract. The last one I have was from 2 years ago. That way we can update his pharmacy information, Gerald West. He can also do a urine drug screen when he comes in tomorrow.

## 2013-06-13 NOTE — Telephone Encounter (Signed)
Gerald West.Gerald West Gerald West  

## 2013-06-13 NOTE — Telephone Encounter (Signed)
Pt called back and I informed him that he will need to submit a urine sample along with signing an updated pain contract. Pt voiced understanding and agreed.Loralee Pacas [place]

## 2013-06-13 NOTE — Telephone Encounter (Signed)
Patient called ahead of time requesting to pick- up his pain med tomorrow 06-14-13 at 10:00 because he has to be at work at 12:00.  Thank you

## 2013-06-15 LAB — DRUG SCR UR, PAIN MGMT, REFLEX CONF
Amphetamine Screen, Ur: NEGATIVE
Barbiturate Quant, Ur: NEGATIVE
Benzodiazepines.: NEGATIVE
Cocaine Metabolites: NEGATIVE
Creatinine,U: 271.49 mg/dL
Propoxyphene: NEGATIVE

## 2013-06-18 LAB — OPIATES/OPIOIDS (LC/MS-MS)
Codeine Urine: NEGATIVE ng/mL
Hydromorphone: NEGATIVE ng/mL
Morphine Urine: NEGATIVE ng/mL
Norhydrocodone, Ur: NEGATIVE ng/mL
Noroxycodone, Ur: 9898 ng/mL
Oxymorphone: 466 ng/mL

## 2013-06-29 ENCOUNTER — Ambulatory Visit: Payer: BC Managed Care – PPO | Admitting: Family Medicine

## 2013-07-13 ENCOUNTER — Encounter: Payer: Self-pay | Admitting: Family Medicine

## 2013-07-13 ENCOUNTER — Ambulatory Visit (INDEPENDENT_AMBULATORY_CARE_PROVIDER_SITE_OTHER): Payer: BC Managed Care – PPO | Admitting: Family Medicine

## 2013-07-13 VITALS — BP 152/91 | HR 94 | Wt 205.0 lb

## 2013-07-13 DIAGNOSIS — F17200 Nicotine dependence, unspecified, uncomplicated: Secondary | ICD-10-CM

## 2013-07-13 DIAGNOSIS — F329 Major depressive disorder, single episode, unspecified: Secondary | ICD-10-CM

## 2013-07-13 DIAGNOSIS — I10 Essential (primary) hypertension: Secondary | ICD-10-CM

## 2013-07-13 DIAGNOSIS — Z720 Tobacco use: Secondary | ICD-10-CM

## 2013-07-13 DIAGNOSIS — G8929 Other chronic pain: Secondary | ICD-10-CM

## 2013-07-13 DIAGNOSIS — M549 Dorsalgia, unspecified: Secondary | ICD-10-CM

## 2013-07-13 DIAGNOSIS — R7301 Impaired fasting glucose: Secondary | ICD-10-CM

## 2013-07-13 DIAGNOSIS — Z23 Encounter for immunization: Secondary | ICD-10-CM

## 2013-07-13 LAB — POCT GLYCOSYLATED HEMOGLOBIN (HGB A1C): Hemoglobin A1C: 5.8

## 2013-07-13 MED ORDER — OXYCODONE-ACETAMINOPHEN 5-325 MG PO TABS: 1.0000 | ORAL_TABLET | Freq: Two times a day (BID) | ORAL | Status: DC | PRN

## 2013-07-13 MED ORDER — LOSARTAN POTASSIUM 100 MG PO TABS: 100.0000 mg | ORAL_TABLET | Freq: Every day | ORAL | Status: DC

## 2013-07-13 MED ORDER — FLUOXETINE HCL 20 MG PO TABS: 20.0000 mg | ORAL_TABLET | Freq: Every day | ORAL | Status: DC

## 2013-07-13 NOTE — Patient Instructions (Addendum)
Smoking Cessation Quitting smoking is important to your health and has many advantages. However, it is not always easy to quit since nicotine is a very addictive drug. Often times, people try 3 times or more before being able to quit. This document explains the best ways for you to prepare to quit smoking. Quitting takes hard work and a lot of effort, but you can do it. ADVANTAGES OF QUITTING SMOKING  You will live longer, feel better, and live better.  Your body will feel the impact of quitting smoking almost immediately.  Within 20 minutes, blood pressure decreases. Your pulse returns to its normal level.  After 8 hours, carbon monoxide levels in the blood return to normal. Your oxygen level increases.  After 24 hours, the chance of having a heart attack starts to decrease. Your breath, hair, and body stop smelling like smoke.  After 48 hours, damaged nerve endings begin to recover. Your sense of taste and smell improve.  After 72 hours, the body is virtually free of nicotine. Your bronchial tubes relax and breathing becomes easier.  After 2 to 12 weeks, lungs can hold more air. Exercise becomes easier and circulation improves.  The risk of having a heart attack, stroke, cancer, or lung disease is greatly reduced.  After 1 year, the risk of coronary heart disease is cut in half.  After 5 years, the risk of stroke falls to the same as a nonsmoker.  After 10 years, the risk of lung cancer is cut in half and the risk of other cancers decreases significantly.  After 15 years, the risk of coronary heart disease drops, usually to the level of a nonsmoker.  If you are pregnant, quitting smoking will improve your chances of having a healthy baby.  The people you live with, especially any children, will be healthier.  You will have extra money to spend on things other than cigarettes. QUESTIONS TO THINK ABOUT BEFORE ATTEMPTING TO QUIT You may want to talk about your answers with your  caregiver.  Why do you want to quit?  If you tried to quit in the past, what helped and what did not?  What will be the most difficult situations for you after you quit? How will you plan to handle them?  Who can help you through the tough times? Your family? Friends? A caregiver?  What pleasures do you get from smoking? What ways can you still get pleasure if you quit? Here are some questions to ask your caregiver:  How can you help me to be successful at quitting?  What medicine do you think would be best for me and how should I take it?  What should I do if I need more help?  What is smoking withdrawal like? How can I get information on withdrawal? GET READY  Set a quit date.  Change your environment by getting rid of all cigarettes, ashtrays, matches, and lighters in your home, car, or work. Do not let people smoke in your home.  Review your past attempts to quit. Think about what worked and what did not. GET SUPPORT AND ENCOURAGEMENT You have a better chance of being successful if you have help. You can get support in many ways.  Tell your family, friends, and co-workers that you are going to quit and need their support. Ask them not to smoke around you.  Get individual, group, or telephone counseling and support. Programs are available at local hospitals and health centers. Call your local health department for   information about programs in your area.  Spiritual beliefs and practices may help some smokers quit.  Download a "quit meter" on your computer to keep track of quit statistics, such as how long you have gone without smoking, cigarettes not smoked, and money saved.  Get a self-help book about quitting smoking and staying off of tobacco. LEARN NEW SKILLS AND BEHAVIORS  Distract yourself from urges to smoke. Talk to someone, go for a walk, or occupy your time with a task.  Change your normal routine. Take a different route to work. Drink tea instead of coffee.  Eat breakfast in a different place.  Reduce your stress. Take a hot bath, exercise, or read a book.  Plan something enjoyable to do every day. Reward yourself for not smoking.  Explore interactive web-based programs that specialize in helping you quit. GET MEDICINE AND USE IT CORRECTLY Medicines can help you stop smoking and decrease the urge to smoke. Combining medicine with the above behavioral methods and support can greatly increase your chances of successfully quitting smoking.  Nicotine replacement therapy helps deliver nicotine to your body without the negative effects and risks of smoking. Nicotine replacement therapy includes nicotine gum, lozenges, inhalers, nasal sprays, and skin patches. Some may be available over-the-counter and others require a prescription.  Antidepressant medicine helps people abstain from smoking, but how this works is unknown. This medicine is available by prescription.  Nicotinic receptor partial agonist medicine simulates the effect of nicotine in your brain. This medicine is available by prescription. Ask your caregiver for advice about which medicines to use and how to use them based on your health history. Your caregiver will tell you what side effects to look out for if you choose to be on a medicine or therapy. Carefully read the information on the package. Do not use any other product containing nicotine while using a nicotine replacement product.  RELAPSE OR DIFFICULT SITUATIONS Most relapses occur within the first 3 months after quitting. Do not be discouraged if you start smoking again. Remember, most people try several times before finally quitting. You may have symptoms of withdrawal because your body is used to nicotine. You may crave cigarettes, be irritable, feel very hungry, cough often, get headaches, or have difficulty concentrating. The withdrawal symptoms are only temporary. They are strongest when you first quit, but they will go away within  10 14 days. To reduce the chances of relapse, try to:  Avoid drinking alcohol. Drinking lowers your chances of successfully quitting.  Reduce the amount of caffeine you consume. Once you quit smoking, the amount of caffeine in your body increases and can give you symptoms, such as a rapid heartbeat, sweating, and anxiety.  Avoid smokers because they can make you want to smoke.  Do not let weight gain distract you. Many smokers will gain weight when they quit, usually less than 10 pounds. Eat a healthy diet and stay active. You can always lose the weight gained after you quit.  Find ways to improve your mood other than smoking. FOR MORE INFORMATION  www.smokefree.gov  Document Released: 08/03/2001 Document Revised: 02/08/2012 Document Reviewed: 11/18/2011 [facility] Patient Information 2014 [place], [place].    Insomnia Insomnia means you have trouble falling or staying asleep. It affects about one person in three at different times and is usually related to stress from work, school, or personal relations. Insomnia is also a sign of depression or anxiety. Other medical problems that cause insomnia include conditions that cause pain, night leg cramps,  coughing, shortness of breath, urinary problems, and fevers. Sleep apnea is an abnormal breathing pattern at night that can cause insomnia and loud snoring. Certain medications and excess intake of caffeine drinks (coffee, tea, colas) can also interfere with normal sleep. Treatment for insomnia depends on the cause. Besides specific medical treatment, the following measures can help you relax and get better sleep. Get regular exercise every day, at least several hours before bed time. Try to get to bed at the same time every night. Take a hot bath before retiring to help you relax. Do not stay in bed if you are unable to sleep. During the daytime avoid staying in bed to watch television, eat, or read. Reduce unwanted noise and light in your room. Keep  your room at a comfortable temperature. Avoid alcohol as it causes one to sleep less soundly, may cause you to awaken during the night, and can leave you feeling groggy the next day. Using a mild sedative prescribed or suggested by your caregiver may be needed, but the daily use of sleeping pills is not recommended. Anti-depressant medicines can improve sleep in people with depression. Please call your doctor for follow up care to better understand the cause and proper treatment of your insomnia. Document Released: 09/16/2004 Document Revised: 11/01/2011 Document Reviewed: 08/09/2005 [facility] Patient Information 2014 [place], [place].

## 2013-07-13 NOTE — Progress Notes (Signed)
  Subjective:    Patient ID: Gerald West, male    DOB: 06-16-1966, 47 y.o.   MRN: [id number]  HPI IFG- no inc thrist or urination.   Lab Results  Component Value Date   HGBA1C 5.7 11/10/2012    HTN -  Pt denies chest pain, SOB, dizziness, or heart palpitations.  Taking meds as directed w/o problems.  Denies medication side effects.  5 min spent with pt.  Tob abuse - has cut back on smoking but not quit yet.    Mood - Says mood has been good. Ran out of prozac about 2 days ago. Needs refill on meds.  Wants to continue his current regimen. He's happy with it. He does report some occasional sweats at night and wonders if this could be related to the medication.  Review of Systems     Objective:   Physical Exam  Constitutional: He is oriented to person, place, and time. He appears well-developed and well-nourished.  HENT:  Head: Normocephalic and atraumatic.  Cardiovascular: Normal rate, regular rhythm and normal heart sounds.   Pulmonary/Chest: Effort normal and breath sounds normal.  Neurological: He is alert and oriented to person, place, and time.  Skin: Skin is warm and dry.  Psychiatric: He has a normal mood and affect. His behavior is normal.          Assessment & Plan:  HTN- uncontrolled but not sure if is taking the new 100mg  dose or his old 50 mg dose of losartan or note. F/U in one month on the 100mg  and recheck pressure at that time.   IFG- Well controlled. Continue to work on exercise and diet and recheck in 6 mo.  Lab Results  Component Value Date   HGBA1C 5.8 07/13/2013    tob abuse- Encouraged cessation.  Handout given.  Depression - PHQ 9 score of 3. Well controlled. He wants to continue his current regimen. Refill sent to pharmacy. He also complains of some insomnia. Recommend a trial of over-the-counter medication such as Benadryl or valerian root. Also given handout on good sleep hygiene.  Chronic Lumbar pain-his urine drug screen was positive for  marijuana. We had a long discussion about this today. If not prepared uses medication especially when he is taking chronic narcotics. He will be urine drug screen tested periodically and he is fully aware. Did refill his medications today.  Flu shot given today.

## 2013-08-10 ENCOUNTER — Encounter: Payer: Self-pay | Admitting: Family Medicine

## 2013-08-10 ENCOUNTER — Ambulatory Visit (INDEPENDENT_AMBULATORY_CARE_PROVIDER_SITE_OTHER): Payer: Self-pay | Admitting: Family Medicine

## 2013-08-10 VITALS — BP 137/89 | HR 91 | Temp 97.8°F | Wt 206.0 lb

## 2013-08-10 DIAGNOSIS — I10 Essential (primary) hypertension: Secondary | ICD-10-CM

## 2013-08-10 DIAGNOSIS — M545 Low back pain, unspecified: Secondary | ICD-10-CM

## 2013-08-10 DIAGNOSIS — K649 Unspecified hemorrhoids: Secondary | ICD-10-CM

## 2013-08-10 DIAGNOSIS — G8929 Other chronic pain: Secondary | ICD-10-CM

## 2013-08-10 MED ORDER — LIDOCAINE-HYDROCORTISONE ACE 3-0.5 % RE CREA: 1.0000 | TOPICAL_CREAM | Freq: Two times a day (BID) | RECTAL | Status: DC | PRN

## 2013-08-10 MED ORDER — OXYCODONE-ACETAMINOPHEN 5-325 MG PO TABS: 1.0000 | ORAL_TABLET | Freq: Two times a day (BID) | ORAL | Status: DC | PRN

## 2013-08-10 MED ORDER — LOSARTAN POTASSIUM 100 MG PO TABS: 100.0000 mg | ORAL_TABLET | Freq: Every day | ORAL | Status: DC

## 2013-08-10 NOTE — Progress Notes (Signed)
   Subjective:    Patient ID: Gerald West, male    DOB: Apr 07, 1966, 47 y.o.   MRN: [id number]  HPI HTN -  Pt denies chest pain, SOB, dizziness, or heart palpitations.  Taking meds as directed w/o problems.  Denies medication side effects.  Has been much more consistant with taking medication.   Chronic low back pain - Would like refill on pain med today. He is really due Monday but will be traveling for Holidays. \  Hemorrhoids-he has been having problems with his hemorrhoids. He felt that the GI doctor would have addressed this with him when he went for his colonoscopy. He would like to have a cream to apply. He says tried at least 3 or 4 over-the-counter agents and nothing has provided significant relief. He says at some point he may actually have to see a Careers adviser.  Review of Systems     Objective:   Physical Exam  Constitutional: He is oriented to person, place, and time. He appears well-developed and well-nourished.  HENT:  Head: Normocephalic and atraumatic.  Cardiovascular: Normal rate, regular rhythm and normal heart sounds.   Pulmonary/Chest: Effort normal and breath sounds normal.  Neurological: He is alert and oriented to person, place, and time.  Skin: Skin is warm and dry.  Psychiatric: He has a normal mood and affect. His behavior is normal.          Assessment & Plan:  HTN - well controlled. Continue current regimen. F/U in 6 month.  Continue to work on getting consistent with taking his medications daily. This only makes a big difference in the control of his pressures.  Hemorrhoids - Will send over rx for anamantle. Call if not improving. We can certainly refer to Gen. surgery if needed.  Chronic low back pain - will refill Percocet today. F/U in 3 months. We'll likely repeat urine drug analysis at that time.

## 2013-08-25 ENCOUNTER — Emergency Department (HOSPITAL_BASED_OUTPATIENT_CLINIC_OR_DEPARTMENT_OTHER): Payer: BC Managed Care – PPO

## 2013-08-25 ENCOUNTER — Emergency Department (HOSPITAL_BASED_OUTPATIENT_CLINIC_OR_DEPARTMENT_OTHER)
Admission: EM | Admit: 2013-08-25 | Discharge: 2013-08-25 | Disposition: A | Discharge status: Home / Self Care | Payer: BC Managed Care – PPO | Attending: Emergency Medicine | Admitting: Emergency Medicine

## 2013-08-25 ENCOUNTER — Encounter (HOSPITAL_BASED_OUTPATIENT_CLINIC_OR_DEPARTMENT_OTHER): Payer: Self-pay | Admitting: Emergency Medicine

## 2013-08-25 DIAGNOSIS — E669 Obesity, unspecified: Secondary | ICD-10-CM | POA: Insufficient documentation

## 2013-08-25 DIAGNOSIS — M549 Dorsalgia, unspecified: Secondary | ICD-10-CM | POA: Insufficient documentation

## 2013-08-25 DIAGNOSIS — M2550 Pain in unspecified joint: Secondary | ICD-10-CM | POA: Insufficient documentation

## 2013-08-25 DIAGNOSIS — J111 Influenza due to unidentified influenza virus with other respiratory manifestations: Secondary | ICD-10-CM | POA: Insufficient documentation

## 2013-08-25 DIAGNOSIS — G8929 Other chronic pain: Secondary | ICD-10-CM | POA: Insufficient documentation

## 2013-08-25 DIAGNOSIS — K219 Gastro-esophageal reflux disease without esophagitis: Secondary | ICD-10-CM | POA: Insufficient documentation

## 2013-08-25 DIAGNOSIS — R509 Fever, unspecified: Secondary | ICD-10-CM | POA: Insufficient documentation

## 2013-08-25 DIAGNOSIS — R6889 Other general symptoms and signs: Secondary | ICD-10-CM

## 2013-08-25 DIAGNOSIS — Z79899 Other long term (current) drug therapy: Secondary | ICD-10-CM | POA: Insufficient documentation

## 2013-08-25 DIAGNOSIS — F411 Generalized anxiety disorder: Secondary | ICD-10-CM | POA: Insufficient documentation

## 2013-08-25 DIAGNOSIS — J3489 Other specified disorders of nose and nasal sinuses: Secondary | ICD-10-CM | POA: Insufficient documentation

## 2013-08-25 DIAGNOSIS — IMO0001 Reserved for inherently not codable concepts without codable children: Secondary | ICD-10-CM | POA: Insufficient documentation

## 2013-08-25 DIAGNOSIS — F3289 Other specified depressive episodes: Secondary | ICD-10-CM | POA: Insufficient documentation

## 2013-08-25 DIAGNOSIS — I10 Essential (primary) hypertension: Secondary | ICD-10-CM | POA: Insufficient documentation

## 2013-08-25 DIAGNOSIS — F329 Major depressive disorder, single episode, unspecified: Secondary | ICD-10-CM | POA: Insufficient documentation

## 2013-08-25 DIAGNOSIS — F17200 Nicotine dependence, unspecified, uncomplicated: Secondary | ICD-10-CM | POA: Insufficient documentation

## 2013-08-25 MED ORDER — ACETAMINOPHEN 500 MG PO TABS: 1000.0000 mg | ORAL_TABLET | Freq: Once | ORAL | Status: DC

## 2013-08-25 NOTE — Discharge Instructions (Signed)
Continue tylenol, motrin and fluids.  If you were given medicines take as directed.  If you are on coumadin or contraceptives realize their levels and effectiveness is altered by many different medicines.  If you have any reaction (rash, tongues swelling, other) to the medicines stop taking and see a physician.   Please follow up as directed and return to the ER or see a physician for new or worsening symptoms.  Thank you.

## 2013-08-25 NOTE — ED Notes (Signed)
MD at bedside. 

## 2013-08-25 NOTE — ED Notes (Signed)
Pt having fever, cough, congestion, chills since Tuesday.  Not getting better with otc medications.

## 2013-08-25 NOTE — ED Provider Notes (Signed)
CSN: [id number]     Arrival date & time 08/25/13  0857 History   First MD Initiated Contact with Patient 08/25/13 [phone]     Chief Complaint  Patient presents with  . Cough  . Nasal Congestion  . Fever   (Consider location/radiation/quality/duration/timing/severity/associated sxs/prior Treatment) HPI Comments: 48 yo male with lipids, htn, smoking hx presents with cough, fever, body aches for 4 days, nothing improves. No current abx, sick contacts or travel.  Tolerating po.  Tried OTC without improvement.  Patient is a 48 y.o. male presenting with cough and fever. The history is provided by the patient.  Cough Associated symptoms: chills, fever and myalgias   Associated symptoms: no chest pain, no headaches, no rash and no shortness of breath   Fever Associated symptoms: chills, congestion, cough and myalgias   Associated symptoms: no chest pain, no headaches, no rash and no vomiting     Past Medical History  Diagnosis Date  . Anxiety   . Depression   . IFG (impaired fasting glucose)     Borderline T2DM  . Hyperlipidemia   . Obesity     Class III  . Hypertension   . Smoker   . Chronic back pain   . GERD (gastroesophageal reflux disease)    No past surgical history on file. Family History  Problem Relation Age of Onset  . Other Brother 57    AMI  . Depression Neg Hx   . Bipolar disorder Neg Hx   . Prostate cancer Paternal Grandfather   . Heart attack Brother   . Heart disease Paternal Uncle    History  Substance Use Topics  . Smoking status: Current Every Day Smoker -- 0.50 packs/day for 17 years    Types: Cigarettes  . Smokeless tobacco: Never Used  . Alcohol Use: 5.0 oz/week    10 drink(s) per week     Comment: >10 beers a week    Review of Systems  Constitutional: Positive for fever, chills and appetite change.  HENT: Positive for congestion.   Eyes: Negative for visual disturbance.  Respiratory: Positive for cough. Negative for shortness of breath.    Cardiovascular: Negative for chest pain.  Gastrointestinal: Negative for vomiting and abdominal pain.  Musculoskeletal: Positive for arthralgias and myalgias. Negative for back pain, neck pain and neck stiffness.  Skin: Negative for rash.  Neurological: Negative for light-headedness and headaches.    Allergies  Ace inhibitors and Nsaids  Home Medications   Current Outpatient Rx  Name  Route  Sig  Dispense  Refill  . FLUoxetine (PROZAC) 20 MG tablet   Oral   Take 1 tablet (20 mg total) by mouth daily.   90 tablet   1   . lidocaine-hydrocortisone (ANAMANTEL HC) 3-0.5 % CREA   Rectal   Place 1 Applicatorful rectally 2 (two) times daily as needed.   85 g   1   . losartan (COZAAR) 100 MG tablet   Oral   Take 1 tablet (100 mg total) by mouth daily.   90 tablet   1   . omeprazole (PRILOSEC) 20 MG capsule   Oral   Take 20 mg by mouth daily.         Marland Kitchen oxyCODONE-acetaminophen (PERCOCET/ROXICET) 5-325 MG per tablet   Oral   Take 1 tablet by mouth 2 (two) times daily as needed. For severe back pain   60 tablet   0    BP 132/80  Pulse 105  Temp(Src) 100.8 F (38.2 C) (  Oral)  Resp 16  Ht 5\' 5"  (1.651 m)  Wt 205 lb (92.987 kg)  BMI 34.11 kg/m2  SpO2 98% Physical Exam  Nursing note and vitals reviewed. Constitutional: He is oriented to person, place, and time. He appears well-developed and well-nourished.  HENT:  Head: Normocephalic and atraumatic.  Mouth/Throat: Oropharynx is clear and moist.  Eyes: Conjunctivae are normal. Right eye exhibits no discharge. Left eye exhibits no discharge.  Neck: Normal range of motion. Neck supple. No tracheal deviation present.  Cardiovascular: Regular rhythm.   Pulmonary/Chest: Effort normal and breath sounds normal.  Abdominal: Soft.  Musculoskeletal: He exhibits no edema.  Neurological: He is alert and oriented to person, place, and time.  Skin: Skin is warm. No rash noted.  Psychiatric: He has a normal mood and affect.     ED Course  Procedures (including critical care time) Labs Review Labs Reviewed - No data to display Imaging Review Dg Chest 2 View  08/25/2013   CLINICAL DATA:  Cough, congestion, fever  EXAM: CHEST  2 VIEW  COMPARISON:  10/28/2011.  FINDINGS: There are mild chronic bronchitic changes. There is no focal parenchymal opacity, pleural effusion, or pneumothorax. The heart and mediastinal contours are unremarkable.  The osseous structures are unremarkable.  IMPRESSION: No active cardiopulmonary disease.   Electronically Signed   By: Kathreen Devoid   On: 08/25/2013 09:54    EKG Interpretation   None       MDM   1. Flu-like symptoms    Flu sxs. No red flags, no meningismus.  Fluids and antipyretics. CXR reviewed by myself, no infiltrate or cardiomegaly or pneumothorax. Supportive care discussed.   Results and differential diagnosis were discussed with the patient. Close follow up outpatient was discussed, patient comfortable with the plan.   Diagnosis: Flu sxs    Mariea Clonts, MD 08/25/13 1003

## 2013-09-10 ENCOUNTER — Telehealth: Payer: Self-pay

## 2013-09-10 MED ORDER — OXYCODONE-ACETAMINOPHEN 5-325 MG PO TABS: 1.0000 | ORAL_TABLET | Freq: Two times a day (BID) | ORAL | Status: DC | PRN

## 2013-09-10 NOTE — Telephone Encounter (Signed)
Patient called for his prescription of oxycodone.

## 2013-10-10 ENCOUNTER — Other Ambulatory Visit: Payer: Self-pay | Admitting: *Deleted

## 2013-10-10 MED ORDER — OXYCODONE-ACETAMINOPHEN 5-325 MG PO TABS: 1.0000 | ORAL_TABLET | Freq: Two times a day (BID) | ORAL | Status: DC | PRN

## 2013-10-22 ENCOUNTER — Other Ambulatory Visit: Payer: Self-pay | Admitting: Family Medicine

## 2013-11-07 ENCOUNTER — Telehealth: Payer: Self-pay | Admitting: Family Medicine

## 2013-11-07 MED ORDER — OXYCODONE-ACETAMINOPHEN 5-325 MG PO TABS: 1.0000 | ORAL_TABLET | Freq: Two times a day (BID) | ORAL | Status: DC | PRN

## 2013-11-07 NOTE — Telephone Encounter (Signed)
Patient always calls a day before to let us know he will come by tomorrow to pick up his Percocet at the front desk. Thanks, [employer]

## 2013-11-09 ENCOUNTER — Ambulatory Visit: Payer: Self-pay | Admitting: Family Medicine

## 2013-11-16 ENCOUNTER — Encounter: Payer: Self-pay | Admitting: Family Medicine

## 2013-11-16 ENCOUNTER — Ambulatory Visit (INDEPENDENT_AMBULATORY_CARE_PROVIDER_SITE_OTHER): Payer: Self-pay | Admitting: Family Medicine

## 2013-11-16 VITALS — BP 148/93 | HR 96 | Wt 209.0 lb

## 2013-11-16 DIAGNOSIS — F329 Major depressive disorder, single episode, unspecified: Secondary | ICD-10-CM

## 2013-11-16 DIAGNOSIS — I10 Essential (primary) hypertension: Secondary | ICD-10-CM

## 2013-11-16 DIAGNOSIS — G8929 Other chronic pain: Secondary | ICD-10-CM

## 2013-11-16 DIAGNOSIS — M545 Low back pain, unspecified: Secondary | ICD-10-CM

## 2013-11-16 DIAGNOSIS — F3289 Other specified depressive episodes: Secondary | ICD-10-CM

## 2013-11-16 DIAGNOSIS — E785 Hyperlipidemia, unspecified: Secondary | ICD-10-CM

## 2013-11-16 MED ORDER — OXYCODONE-ACETAMINOPHEN 5-325 MG PO TABS: 1.0000 | ORAL_TABLET | Freq: Two times a day (BID) | ORAL | Status: DC | PRN

## 2013-11-16 MED ORDER — LOSARTAN POTASSIUM 100 MG PO TABS: 100.0000 mg | ORAL_TABLET | Freq: Every day | ORAL | Status: DC

## 2013-11-16 MED ORDER — FLUOXETINE HCL 20 MG PO TABS: 20.0000 mg | ORAL_TABLET | Freq: Every day | ORAL | Status: DC
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
## 2013-12-10 ENCOUNTER — Telehealth: Payer: Self-pay | Admitting: Family Medicine

## 2013-12-10 NOTE — Telephone Encounter (Signed)
Patient called today to request picking up his percocet tomorrow 12-11-13? Please call patient IF there is ANY issues. Thanks, [employer]

## 2013-12-11 MED ORDER — OXYCODONE-ACETAMINOPHEN 5-325 MG PO TABS: 1.0000 | ORAL_TABLET | Freq: Two times a day (BID) | ORAL | Status: DC | PRN

## 2013-12-12 NOTE — Telephone Encounter (Signed)
Called and lvm informing pt that rx cannot be filled until 4.27.15.Gerald West

## 2014-01-15 ENCOUNTER — Telehealth: Payer: Self-pay | Admitting: Family Medicine

## 2014-01-15 MED ORDER — OXYCODONE-ACETAMINOPHEN 5-325 MG PO TABS: 1.0000 | ORAL_TABLET | Freq: Two times a day (BID) | ORAL | Status: DC | PRN

## 2014-01-15 NOTE — Telephone Encounter (Signed)
Patient called a day ahead to let us know his percocet is due tomorrow and he will pick it up in the morning 01-16-14. Thanks

## 2014-01-15 NOTE — Telephone Encounter (Signed)
rx printed and signed

## 2014-02-11 ENCOUNTER — Telehealth: Payer: Self-pay | Admitting: Family Medicine

## 2014-02-11 NOTE — Telephone Encounter (Signed)
Ok to pick up early

## 2014-02-11 NOTE — Telephone Encounter (Signed)
Pt informed.Gerald West, Gerald West  

## 2014-02-11 NOTE — Telephone Encounter (Signed)
Gerald West is going out of town on wed. And will be gone for two weeks. Can he pick up the script on wed?

## 2014-02-12 ENCOUNTER — Other Ambulatory Visit: Payer: Self-pay | Admitting: *Deleted

## 2014-02-12 MED ORDER — OXYCODONE-ACETAMINOPHEN 5-325 MG PO TABS: 1.0000 | ORAL_TABLET | Freq: Two times a day (BID) | ORAL | Status: DC | PRN

## 2014-03-01 ENCOUNTER — Ambulatory Visit: Payer: Self-pay | Admitting: Family Medicine

## 2014-03-08 ENCOUNTER — Ambulatory Visit: Payer: Self-pay | Admitting: Family Medicine

## 2014-03-15 ENCOUNTER — Telehealth: Payer: Self-pay | Admitting: Family Medicine

## 2014-03-15 NOTE — Telephone Encounter (Signed)
Patient always calls ahead to pick up his Percocet script and wanted you know he will come by Monday 03/18/14 morning to pick this Rx. up at the front desk.  Thanks, [employer]

## 2014-03-18 ENCOUNTER — Other Ambulatory Visit: Payer: Self-pay | Admitting: *Deleted

## 2014-03-18 MED ORDER — OXYCODONE-ACETAMINOPHEN 5-325 MG PO TABS: 1.0000 | ORAL_TABLET | Freq: Two times a day (BID) | ORAL | Status: DC | PRN

## 2014-03-22 ENCOUNTER — Ambulatory Visit: Payer: Self-pay | Admitting: Family Medicine

## 2014-03-23 IMAGING — CR DG CHEST 2V
2 series · 2 of 2 positions shown · non-contrast
Comparison: 10/28/2011.

CLINICAL DATA: Cough, congestion, fever

EXAM:
CHEST  2 VIEW

[w chest pa]
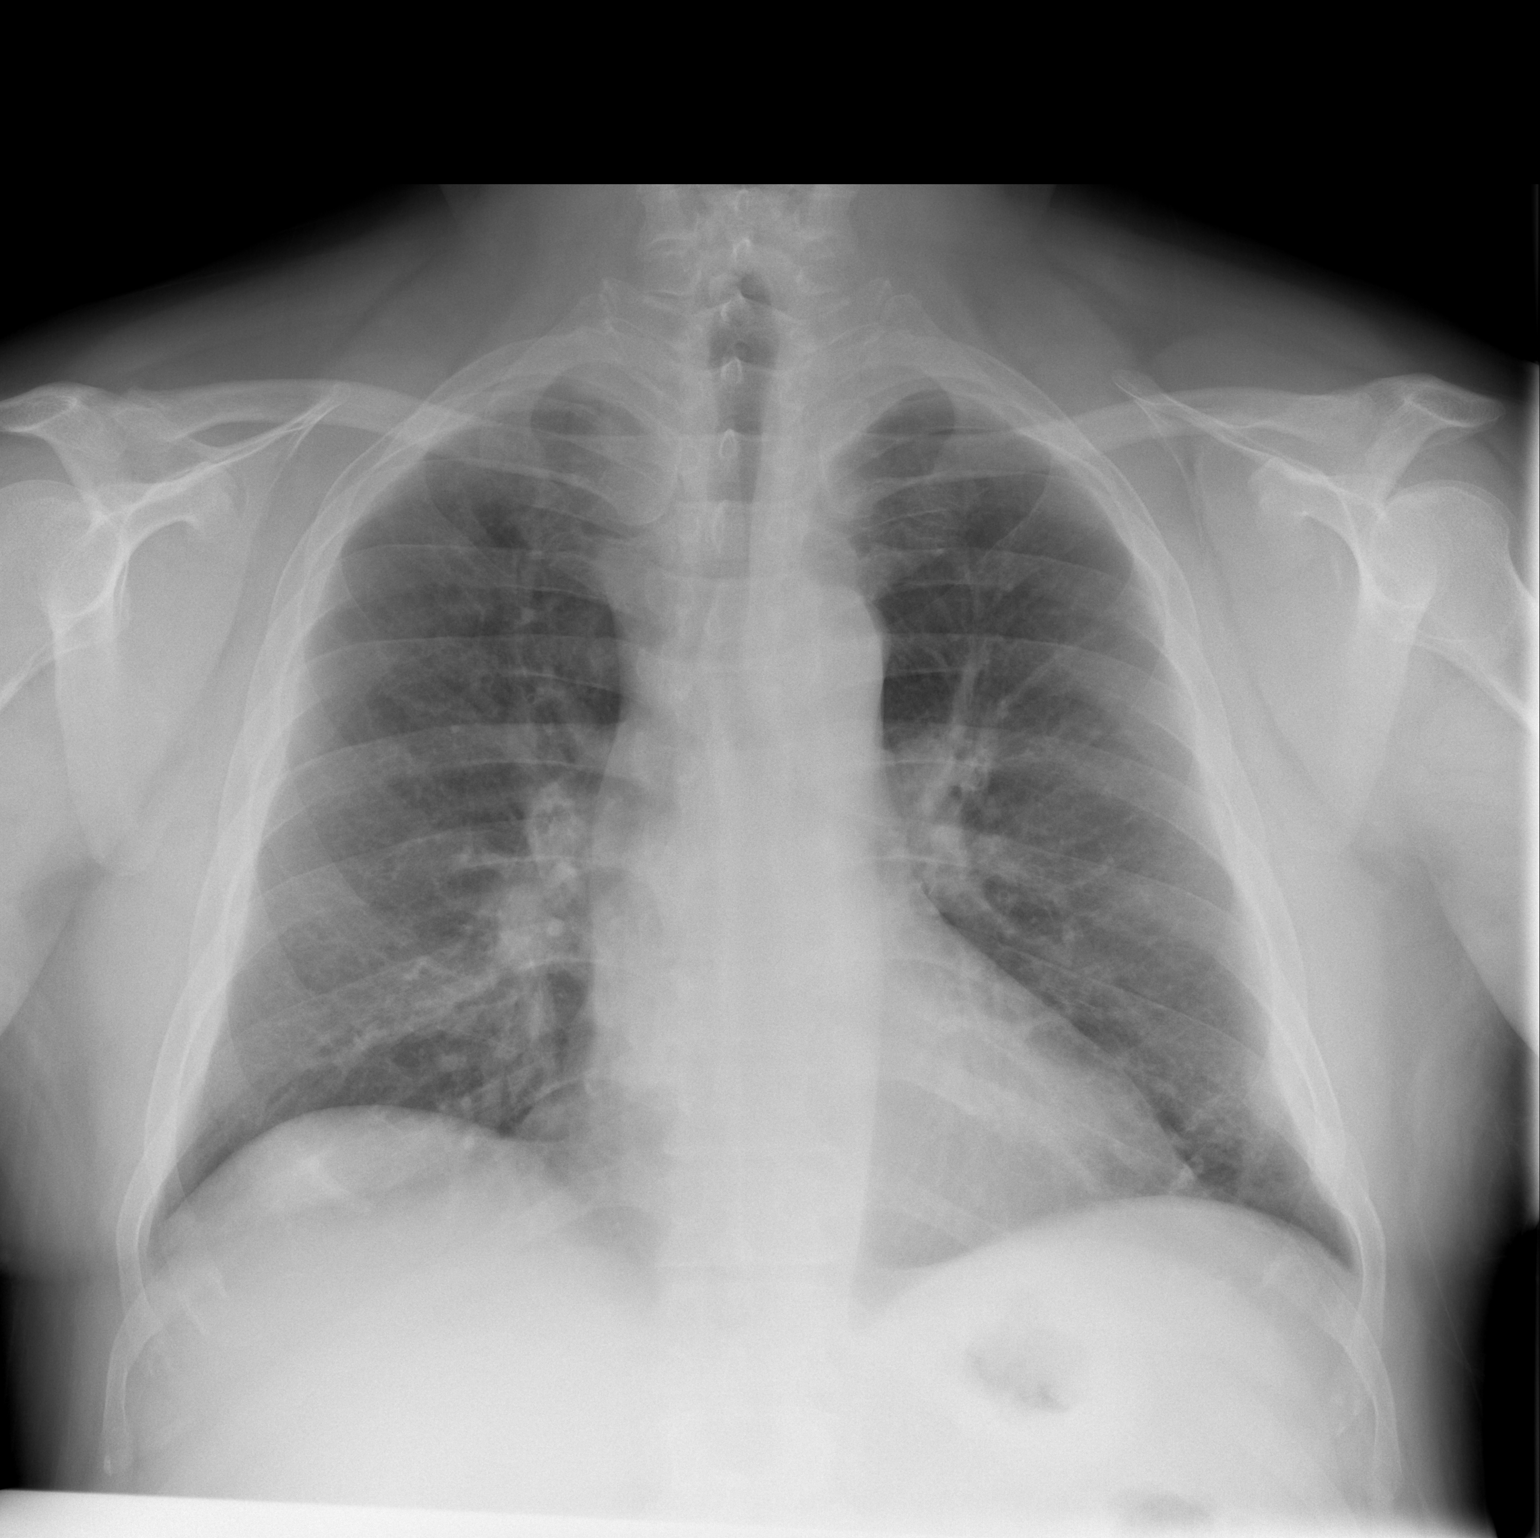

[w chest lat]
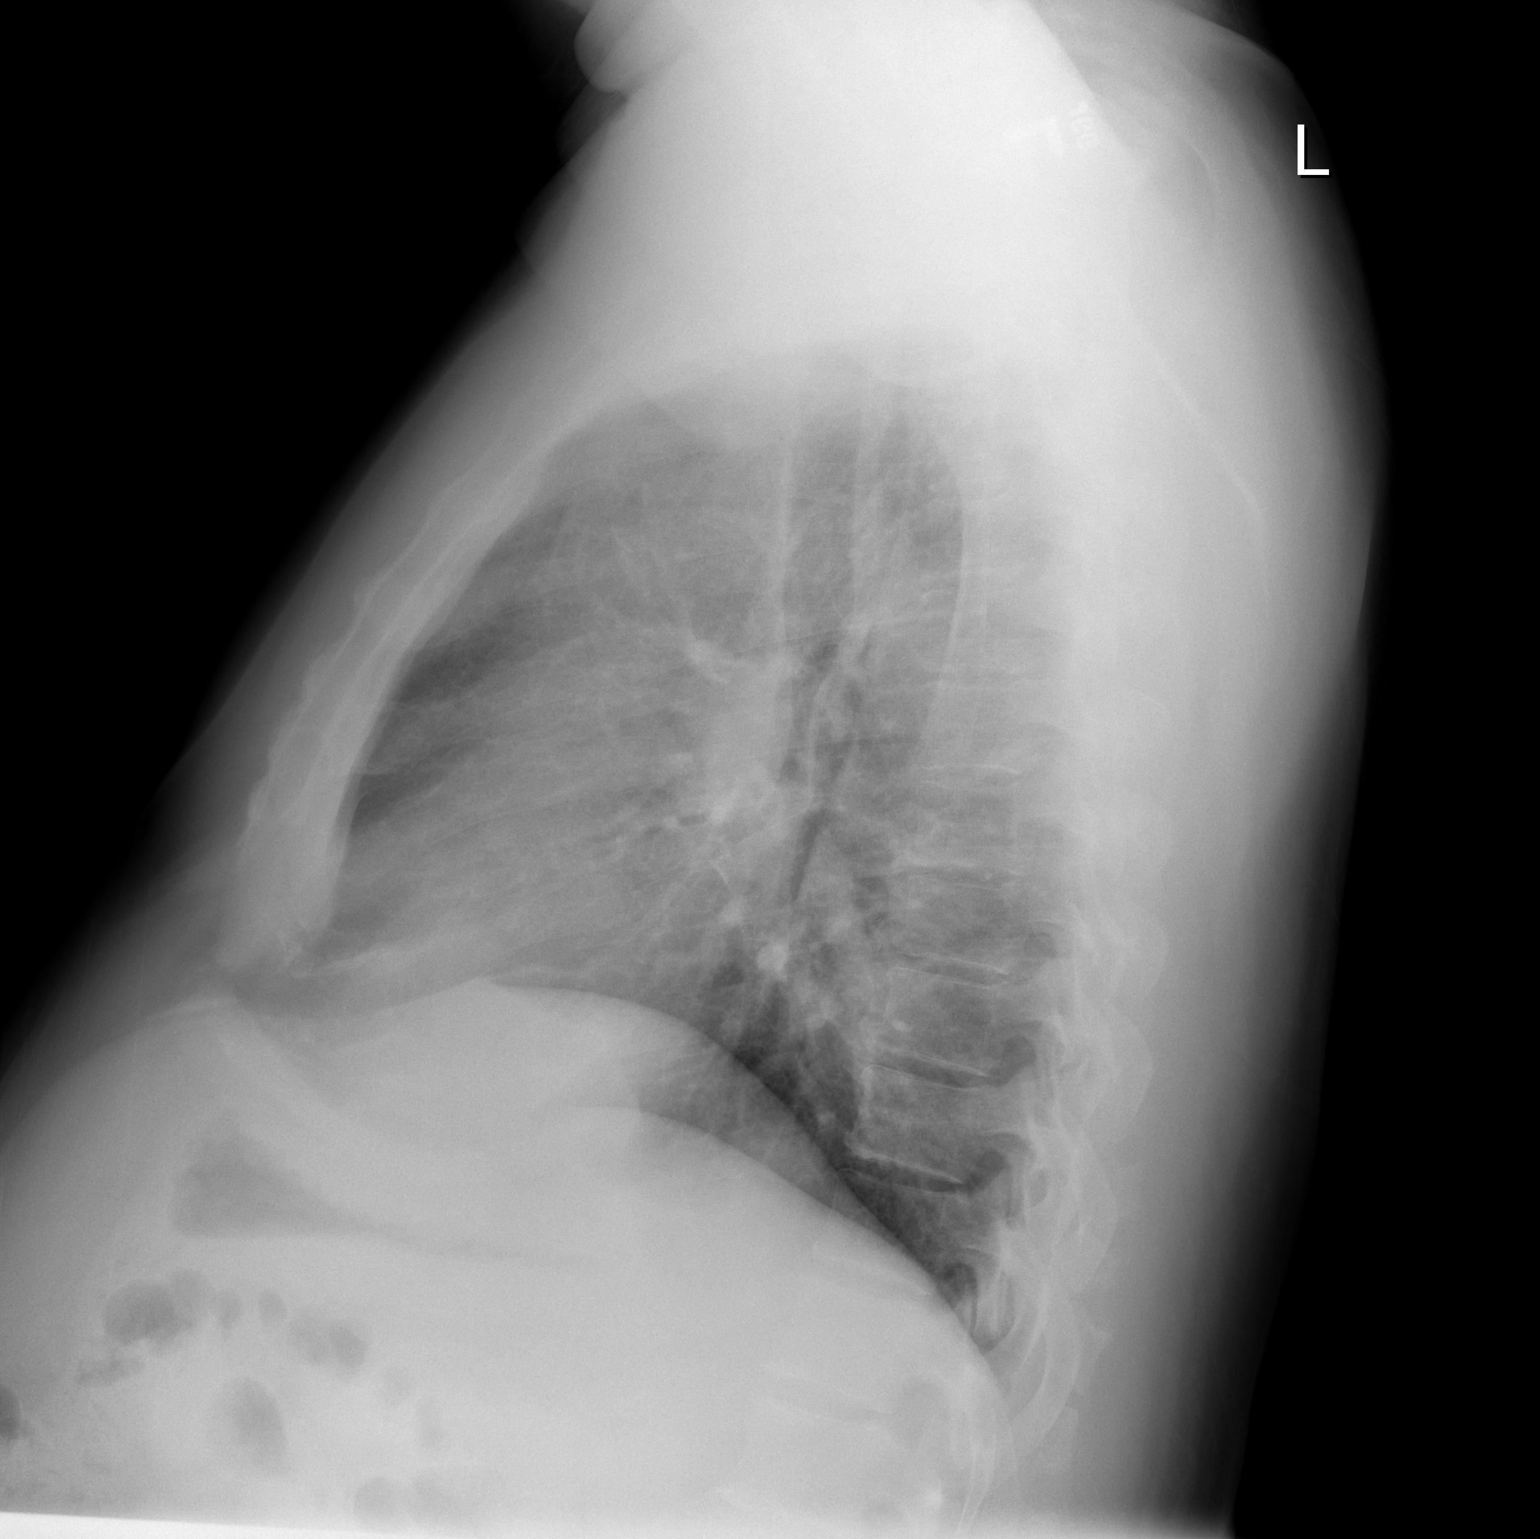

[2 of 2 positions shown; findings below may reference images not displayed]

FINDINGS: There are mild chronic bronchitic changes. There is no focal
parenchymal opacity, pleural effusion, or pneumothorax. The heart
and mediastinal contours are unremarkable.

The osseous structures are unremarkable.
IMPRESSION: No active cardiopulmonary disease.

## 2014-04-12 ENCOUNTER — Ambulatory Visit (INDEPENDENT_AMBULATORY_CARE_PROVIDER_SITE_OTHER): Payer: BC Managed Care – PPO | Admitting: Family Medicine

## 2014-04-12 ENCOUNTER — Encounter: Payer: Self-pay | Admitting: Family Medicine

## 2014-04-12 VITALS — BP 148/86 | HR 89 | Wt 208.0 lb

## 2014-04-12 DIAGNOSIS — K0889 Other specified disorders of teeth and supporting structures: Secondary | ICD-10-CM

## 2014-04-12 DIAGNOSIS — Z79899 Other long term (current) drug therapy: Secondary | ICD-10-CM

## 2014-04-12 DIAGNOSIS — Z23 Encounter for immunization: Secondary | ICD-10-CM

## 2014-04-12 DIAGNOSIS — I10 Essential (primary) hypertension: Secondary | ICD-10-CM

## 2014-04-12 DIAGNOSIS — M545 Low back pain, unspecified: Secondary | ICD-10-CM

## 2014-04-12 DIAGNOSIS — K089 Disorder of teeth and supporting structures, unspecified: Secondary | ICD-10-CM

## 2014-04-12 DIAGNOSIS — R7301 Impaired fasting glucose: Secondary | ICD-10-CM

## 2014-04-12 LAB — POCT GLYCOSYLATED HEMOGLOBIN (HGB A1C): HEMOGLOBIN A1C: 6.2

## 2014-04-12 MED ORDER — OXYCODONE-ACETAMINOPHEN 5-325 MG PO TABS: 1.0000 | ORAL_TABLET | Freq: Two times a day (BID) | ORAL | Status: DC | PRN

## 2014-04-12 NOTE — Assessment & Plan Note (Signed)
We'll perform a urine drug screen today. I will go ahead and increase his Percocet to 10 extra tabs per month so he will get 70 tabs per month. He's not technically due to the next week but will refill early since he is having some tooth pain in one to the dentist until next Wednesday.

## 2014-04-12 NOTE — Assessment & Plan Note (Signed)
Currently well controlled. PHQ 9 score of 3 today. He had 2 points for sleep concerns and one point for low energy. No depressive symptoms currently.

## 2014-04-12 NOTE — Assessment & Plan Note (Signed)
Has increased from last time. A1c was 6.2. Today. We discussed the importance of cutting out soda which he does drink on a daily basis in addition to cutting out concentrated sweets and watching his portion sizes on carbohydrates. We'll repeat hemoglobin A1c in 6 months. Will continue to follow very closely.

## 2014-04-12 NOTE — Progress Notes (Signed)
   Subjective:    Patient ID: Gerald West, male    DOB: 08/10/1966, 48 y.o.   MRN: [id number]  HPI Depression - Doing well on fluoxetine daily.    IFG - No inc thirst or urination.    Hypertension- Pt denies chest pain, SOB, dizziness, or heart palpitations.  Taking meds as directed w/o problems.  Denies medication side effects.    Back pan - on chronic pain medications.  Says running out of his medication  Says his filling fell out of his tooth. He is in pain and has appt on Wednesday with the dentist. Says pain has been keeping his up at night the last 2 nights.   Review of Systems     Objective:   Physical Exam  Constitutional: He is oriented to person, place, and time. He appears well-developed and well-nourished.  HENT:  Head: Normocephalic and atraumatic.  Cardiovascular: Normal rate, regular rhythm and normal heart sounds.   Pulmonary/Chest: Effort normal and breath sounds normal.  Neurological: He is alert and oriented to person, place, and time.  Skin: Skin is warm and dry.  Psychiatric: He has a normal mood and affect. His behavior is normal.          Assessment & Plan:  Tooth pain-will go ahead and refill his oxycodone a little bit early. Urine has appointment with his dentist next Wednesday.

## 2014-04-12 NOTE — Assessment & Plan Note (Signed)
Uncontrolled. I would like him to follow back up in one month to recheck his blood pressure. It is with a nurse visit if needed. If not well controlled then consider adding had chlorothiazide to his regimen. He is in pain today from losing his filling in his tooth. Certainly that could be contributing to his elevated pressure but it was high when I saw him in March as well. We also discussed the importance of weight loss.

## 2014-04-13 LAB — DRUG SCR UR, PAIN MGMT, REFLEX CONF
AMPHETAMINE SCRN UR: NEGATIVE
Barbiturate Quant, Ur: NEGATIVE
Benzodiazepines.: NEGATIVE
COCAINE METABOLITES: NEGATIVE
CREATININE, U: 77.96 mg/dL
METHADONE: NEGATIVE
Marijuana Metabolite: NEGATIVE
Phencyclidine (PCP): NEGATIVE
Propoxyphene: NEGATIVE

## 2014-04-17 LAB — OPIATES/OPIOIDS (LC/MS-MS)
CODEINE URINE: NEGATIVE ng/mL (ref <50)
Hydrocodone: NEGATIVE ng/mL (ref <50)
Hydromorphone: NEGATIVE ng/mL (ref <50)
MORPHINE: NEGATIVE ng/mL (ref <50)
Norhydrocodone, Ur: NEGATIVE ng/mL (ref <50)
Noroxycodone, Ur: 2765 ng/mL — ABNORMAL HIGH (ref <50)
OXYMORPHONE, URINE: 3945 ng/mL — AB (ref <50)
Oxycodone, ur: 2046 ng/mL — ABNORMAL HIGH (ref <50)

## 2014-05-09 ENCOUNTER — Telehealth: Payer: Self-pay | Admitting: Family Medicine

## 2014-05-09 NOTE — Telephone Encounter (Signed)
Patient needs his refill of Percocet that is due on Monday 05-13-14, so he was calling ahead so it will be ready for pick up on Monday morning.

## 2014-05-10 ENCOUNTER — Ambulatory Visit: Payer: Self-pay

## 2014-05-10 MED ORDER — OXYCODONE-ACETAMINOPHEN 5-325 MG PO TABS: 1.0000 | ORAL_TABLET | Freq: Two times a day (BID) | ORAL | Status: DC | PRN

## 2014-05-10 NOTE — Telephone Encounter (Signed)
Ready to pick up.  

## 2014-05-13 ENCOUNTER — Ambulatory Visit: Payer: Self-pay

## 2014-06-11 ENCOUNTER — Telehealth: Payer: Self-pay | Admitting: Family Medicine

## 2014-06-11 MED ORDER — OXYCODONE-ACETAMINOPHEN 5-325 MG PO TABS: 1.0000 | ORAL_TABLET | Freq: Two times a day (BID) | ORAL | Status: DC | PRN

## 2014-06-11 NOTE — Telephone Encounter (Signed)
Patient is due to pick up Percocet tomorrow 06/12/14 but would like to come by and pick up today around 2 while he is working in the area. Tomorrow our office will be closing at 12 for a staff meeting so Im sure today will be much more convenient

## 2014-06-11 NOTE — Telephone Encounter (Signed)
OK to pickup

## 2014-06-11 NOTE — Telephone Encounter (Signed)
Patient picked up. Margette Fast, CMA

## 2014-06-12 ENCOUNTER — Ambulatory Visit: Payer: Self-pay

## 2014-06-22 ENCOUNTER — Emergency Department (HOSPITAL_BASED_OUTPATIENT_CLINIC_OR_DEPARTMENT_OTHER)
Admission: EM | Admit: 2014-06-22 | Discharge: 2014-06-22 | Disposition: A | Discharge status: Home / Self Care | Payer: BC Managed Care – PPO | Attending: Emergency Medicine | Admitting: Emergency Medicine

## 2014-06-22 ENCOUNTER — Encounter (HOSPITAL_BASED_OUTPATIENT_CLINIC_OR_DEPARTMENT_OTHER): Payer: Self-pay | Admitting: Emergency Medicine

## 2014-06-22 ENCOUNTER — Emergency Department (HOSPITAL_BASED_OUTPATIENT_CLINIC_OR_DEPARTMENT_OTHER): Payer: BC Managed Care – PPO

## 2014-06-22 DIAGNOSIS — R079 Chest pain, unspecified: Secondary | ICD-10-CM | POA: Diagnosis present

## 2014-06-22 DIAGNOSIS — I10 Essential (primary) hypertension: Secondary | ICD-10-CM | POA: Insufficient documentation

## 2014-06-22 DIAGNOSIS — F329 Major depressive disorder, single episode, unspecified: Secondary | ICD-10-CM | POA: Diagnosis not present

## 2014-06-22 DIAGNOSIS — E669 Obesity, unspecified: Secondary | ICD-10-CM | POA: Diagnosis not present

## 2014-06-22 DIAGNOSIS — F419 Anxiety disorder, unspecified: Secondary | ICD-10-CM | POA: Diagnosis not present

## 2014-06-22 DIAGNOSIS — Z720 Tobacco use: Secondary | ICD-10-CM | POA: Diagnosis not present

## 2014-06-22 DIAGNOSIS — I200 Unstable angina: Secondary | ICD-10-CM | POA: Diagnosis not present

## 2014-06-22 DIAGNOSIS — K219 Gastro-esophageal reflux disease without esophagitis: Secondary | ICD-10-CM | POA: Diagnosis not present

## 2014-06-22 DIAGNOSIS — Z79899 Other long term (current) drug therapy: Secondary | ICD-10-CM | POA: Insufficient documentation

## 2014-06-22 DIAGNOSIS — G8929 Other chronic pain: Secondary | ICD-10-CM | POA: Diagnosis not present

## 2014-06-22 LAB — BASIC METABOLIC PANEL
Anion gap: 20 — ABNORMAL HIGH (ref 5–15)
BUN: 15 mg/dL (ref 6–23)
CHLORIDE: 96 meq/L (ref 96–112)
CO2: 20 mEq/L (ref 19–32)
Calcium: 9.4 mg/dL (ref 8.4–10.5)
Creatinine, Ser: 0.7 mg/dL (ref 0.50–1.35)
GFR calc Af Amer: >90 mL/min (ref >90)
GLUCOSE: 108 mg/dL — AB (ref 70–99)
Potassium: 4.1 mEq/L (ref 3.7–5.3)
Sodium: 136 mEq/L — ABNORMAL LOW (ref 137–147)

## 2014-06-22 LAB — CBC WITH DIFFERENTIAL/PLATELET
Basophils Absolute: 0.1 10*3/uL (ref 0.0–0.1)
Basophils Relative: 1 % (ref 0–1)
EOS ABS: 0.5 10*3/uL (ref 0.0–0.7)
Eosinophils Relative: 4 % (ref 0–5)
HCT: 40 % (ref 39.0–52.0)
HEMOGLOBIN: 13.8 g/dL (ref 13.0–17.0)
Lymphocytes Relative: 38 % (ref 12–46)
Lymphs Abs: 4.1 10*3/uL — ABNORMAL HIGH (ref 0.7–4.0)
MCH: 29.3 pg (ref 26.0–34.0)
MCHC: 34.5 g/dL (ref 30.0–36.0)
MCV: 84.9 fL (ref 78.0–100.0)
Monocytes Absolute: 0.6 10*3/uL (ref 0.1–1.0)
Monocytes Relative: 6 % (ref 3–12)
NEUTROS PCT: 51 % (ref 43–77)
Neutro Abs: 5.6 10*3/uL (ref 1.7–7.7)
Platelets: 330 10*3/uL (ref 150–400)
RBC: 4.71 MIL/uL (ref 4.22–5.81)
RDW: 13.3 % (ref 11.5–15.5)
WBC: 10.8 10*3/uL — ABNORMAL HIGH (ref 4.0–10.5)

## 2014-06-22 LAB — TROPONIN I
Troponin I: <0.3 ng/mL (ref <0.30)
Troponin I: <0.3 ng/mL (ref <0.30)

## 2014-06-22 NOTE — ED Notes (Signed)
Came to room to draw repeat trop.  Pt has gone outside to smoke.

## 2014-06-22 NOTE — ED Provider Notes (Signed)
CSN: [id number]     Arrival date & time 06/22/14  1344 History   First MD Initiated Contact with Patient 06/22/14 1348     Chief Complaint  Patient presents with  . Chest Pain     (Consider location/radiation/quality/duration/timing/severity/associated sxs/prior Treatment) HPI Comments: Patient is a 48 year old male with a past medical history of hypertension, hyperlipidemia, obesity and tobacco abuse who presents with chest pain for the past 3 months. Patient reports intermittent chest pain that starts and stops spontaneously. The pain is described as aching and located in his left chest with radiation to his left neck. The pain will last anywhere from 30 seconds to 10 minutes and is not associated with exertion. Patient reports associated nausea and SOB with the chest pain. Patient reports his brother had an MI at age 73. Patient's last episode occurred this morning.    Past Medical History  Diagnosis Date  . Anxiety   . Depression   . IFG (impaired fasting glucose)     Borderline T2DM  . Hyperlipidemia   . Obesity     Class III  . Hypertension   . Smoker   . Chronic back pain   . GERD (gastroesophageal reflux disease)    History reviewed. No pertinent past surgical history. Family History  Problem Relation Age of Onset  . Other Brother 47    AMI  . Depression Neg Hx   . Bipolar disorder Neg Hx   . Prostate cancer Paternal Grandfather   . Heart attack Brother   . Heart disease Paternal Uncle    History  Substance Use Topics  . Smoking status: Current Every Day Smoker -- 0.50 packs/day for 17 years    Types: Cigarettes  . Smokeless tobacco: Never Used  . Alcohol Use: 5.0 oz/week    10 drink(s) per week     Comment: >10 beers a week    Review of Systems  Constitutional: Negative for fever, chills and fatigue.  HENT: Negative for trouble swallowing.   Eyes: Negative for visual disturbance.  Respiratory: Positive for shortness of breath.   Cardiovascular: Positive  for chest pain. Negative for palpitations.  Gastrointestinal: Positive for nausea. Negative for vomiting, abdominal pain and diarrhea.  Genitourinary: Negative for dysuria and difficulty urinating.  Musculoskeletal: Negative for arthralgias and neck pain.  Skin: Negative for color change.  Neurological: Negative for dizziness and weakness.  Psychiatric/Behavioral: Negative for dysphoric mood.      Allergies  Ace inhibitors and Nsaids  Home Medications   Prior to Admission medications   Medication Sig Start Date End Date Taking? Authorizing Provider  FLUoxetine (PROZAC) 20 MG tablet Take 1 tablet (20 mg total) by mouth daily. 11/16/13   Hali Marry, MD  losartan (COZAAR) 100 MG tablet Take 1 tablet (100 mg total) by mouth daily. 11/16/13 11/16/14  Hali Marry, MD  omeprazole (PRILOSEC) 20 MG capsule Take 20 mg by mouth daily.    Historical Provider, MD  oxyCODONE-acetaminophen (PERCOCET/ROXICET) 5-325 MG per tablet Take 1-2 tablets by mouth 2 (two) times daily as needed. This is a 30 day suppy for severe back pain 06/11/14   Hali Marry, MD   BP 145/94  Pulse 86  Temp(Src) 97.9 F (36.6 C) (Oral)  Resp 20  Ht 5\' 4"  (1.626 m)  Wt 210 lb (95.255 kg)  BMI 36.03 kg/m2  SpO2 96% Physical Exam  Nursing note and vitals reviewed. Constitutional: He is oriented to person, place, and time. He appears well-developed  and well-nourished. No distress.  HENT:  Head: Normocephalic and atraumatic.  Eyes: Conjunctivae and EOM are normal.  Neck: Normal range of motion.  Cardiovascular: Normal rate and regular rhythm.  Exam reveals no gallop and no friction rub.   No murmur heard. Pulmonary/Chest: Effort normal and breath sounds normal. He has no wheezes. He has no rales. He exhibits no tenderness.  Abdominal: Soft. He exhibits no distension. There is no tenderness. There is no rebound.  Musculoskeletal: Normal range of motion.  Neurological: He is alert and oriented to  person, place, and time. Coordination normal.  Speech is goal-oriented. Moves limbs without ataxia.   Skin: Skin is warm and dry.  Psychiatric: He has a normal mood and affect. His behavior is normal.    ED Course  Procedures (including critical care time) Labs Review Labs Reviewed  CBC WITH DIFFERENTIAL - Abnormal; Notable for the following:    WBC 10.8 (*)    Lymphs Abs 4.1 (*)    All other components within normal limits  BASIC METABOLIC PANEL - Abnormal; Notable for the following:    Sodium 136 (*)    Glucose, Bld 108 (*)    Anion gap 20 (*)    All other components within normal limits  TROPONIN I  TROPONIN I    Imaging Review Dg Chest 2 View  06/22/2014   CLINICAL DATA:  Chest pain for several days  EXAM: CHEST  2 VIEW  COMPARISON:  08/25/2013  FINDINGS: Chronic left basilar and lateral rib deformity. Clear lungs. No pneumothorax or pleural effusion. No vertebral compression. Normal heart size. Normal vascularity.  IMPRESSION: No active cardiopulmonary disease.   Electronically Signed   By: Maryclare Bean M.D.   On: 06/22/2014 14:09     EKG Interpretation   Date/Time:  Saturday June 22 2014 13:49:32 EDT Ventricular Rate:  91 PR Interval:  186 QRS Duration: 102 QT Interval:  354 QTC Calculation: 435 R Axis:   79 Text Interpretation:  Normal sinus rhythm Normal ECG Confirmed by POLLINA   MD, CHRISTOPHER ([id number]) on 06/22/2014 1:57:42 PM      MDM   Final diagnoses:  Chest pain  Unstable angina    2:30 PM Labs pending. EKG shows NSR without acute changes. Chest xray shows chronic rib deformity without acute changes.   6:44 PM Labs unremarkable for acute changes. Patient has negative delta troponin. I spoke with Dr. Claiborne Billings, Cardiologist on call who is OK with discharge and close office follow up. He reports it would not be unreasonable for the patient to be admitted for observation depending on the patient's comfort level. Patient is OK with discharge and return to  the ED with worsening or concerning symptoms. Patient will have office follow up. Patient having no symptoms at this time.   Alvina Chou, PA-C 06/23/14 0003

## 2014-06-22 NOTE — ED Notes (Addendum)
Patient c/o intermittent CP over the past month and pain in L side of neck. Patient states it last for 30 seconds to a minute and feels as if someone is sticking him with a pin. Takes oxycodone for chronic back pain & takes meds for reflux

## 2014-06-22 NOTE — Discharge Instructions (Signed)
Call the cardiologist on Monday morning to schedule an appointment. Refer to attached documents for more information. Return to the ED with worsening or concerning symptoms.

## 2014-06-25 ENCOUNTER — Encounter: Payer: Self-pay | Admitting: Cardiovascular Disease

## 2014-06-25 ENCOUNTER — Ambulatory Visit (INDEPENDENT_AMBULATORY_CARE_PROVIDER_SITE_OTHER): Payer: BC Managed Care – PPO | Admitting: Cardiovascular Disease

## 2014-06-25 VITALS — BP 142/98 | HR 88 | Ht 64.0 in | Wt 210.0 lb

## 2014-06-25 DIAGNOSIS — I10 Essential (primary) hypertension: Secondary | ICD-10-CM

## 2014-06-25 DIAGNOSIS — R079 Chest pain, unspecified: Secondary | ICD-10-CM

## 2014-06-25 DIAGNOSIS — Z720 Tobacco use: Secondary | ICD-10-CM | POA: Insufficient documentation

## 2014-06-25 NOTE — Assessment & Plan Note (Signed)
The chest pain is overall atypical and does not seem to be anginal in features. The sharp pain seems to be musculoskeletal but some of his symptoms might also be related to GERD. Nonetheless, he has significant exertional dyspnea with multiple risk factors for coronary artery disease. Thus, I requested a stress echocardiogram for evaluation. I discussed with the patient the importance of lifestyle changes in order to decrease the chance of future coronary artery disease and cardiovascular events. We discussed the importance of controlling risk factors, healthy diet as well as regular exercise. I also explained to him that a normal stress test does not rule out atherosclerosis.

## 2014-06-25 NOTE — Progress Notes (Signed)
Primary care physician:Dr. Madilyn Fireman  HPI  This is a pleasant 48 year old man who was referred for evaluation of chest pain. He has no previous cardiac history. He has chronic medical conditions that include hypertension, GERD,hyperlipidemia and tobacco use. He has strong family history of premature coronary artery disease. His brother had myocardial infarction at the age of 32.  He has been having intermittent episodes of chest discomfort described as sharp pain lasting for a few seconds in the left substernal area. This has been occasionally associated with left neck pain. He does have heartburn and takes omeprazole as needed. The pain has no pattern and is not associated with physical activities. He complains of chronic exertional dyspnea with no significant worsening recently. He went to the emergency room at [facility]. Labs were unremarkable including negative cardiac enzymes. ECG showed no acute changes and chest x-ray was normal.  Allergies  Allergen Reactions  . Ace Inhibitors Cough  . Nsaids     REACTION: gastritis     Current Outpatient Prescriptions on File Prior to Visit  Medication Sig Dispense Refill  . FLUoxetine (PROZAC) 20 MG tablet Take 1 tablet (20 mg total) by mouth daily. 90 tablet 1  . losartan (COZAAR) 100 MG tablet Take 1 tablet (100 mg total) by mouth daily. 90 tablet 1  . omeprazole (PRILOSEC) 20 MG capsule Take 20 mg by mouth daily.    Marland Kitchen oxyCODONE-acetaminophen (PERCOCET/ROXICET) 5-325 MG per tablet Take 1-2 tablets by mouth 2 (two) times daily as needed. This is a 30 day suppy for severe back pain 70 tablet 0  . [DISCONTINUED] atenolol-chlorthalidone (TENORETIC) 50-25 MG per tablet Take 1 tablet by mouth daily. 30 tablet 2   No current facility-administered medications on file prior to visit.     Past Medical History  Diagnosis Date  . Anxiety   . Depression   . IFG (impaired fasting glucose)     Borderline T2DM  . Hyperlipidemia   . Obesity     Class III    . Hypertension   . Smoker   . Chronic back pain   . GERD (gastroesophageal reflux disease)      No past surgical history on file.   Family History  Problem Relation Age of Onset  . Other Brother 97    AMI  . Depression Neg Hx   . Bipolar disorder Neg Hx   . Prostate cancer Paternal Grandfather   . Heart attack Brother   . Heart disease Paternal Uncle      History   Social History  . Marital Status: Married    Spouse Name: N/A    Number of Children: 2  . Years of Education: N/A   Occupational History  . [employer]    Social History Main Topics  . Smoking status: Current Every Day Smoker -- 0.50 packs/day for 17 years    Types: Cigarettes  . Smokeless tobacco: Never Used  . Alcohol Use: 5.0 oz/week    10 drink(s) per week     Comment: >10 beers a week  . Drug Use: No  . Sexual Activity: Not on file   Other Topics Concern  . Not on file   Social History Narrative     ROS A 10 point review of system was performed. It is negative other than that mentioned in the history of present illness.   PHYSICAL EXAM   BP 142/98 mmHg  Pulse 88  Ht 5\' 4"  (1.626 m)  Wt 210 lb (  95.255 kg)  BMI 36.03 kg/m2 Constitutional: He is oriented to person, place, and time. He appears well-developed and well-nourished. No distress.  HENT: No nasal discharge.  Head: Normocephalic and atraumatic.  Eyes: Pupils are equal and round.  No discharge. Neck: Normal range of motion. Neck supple. No JVD present. No thyromegaly present.  Cardiovascular: Normal rate, regular rhythm, normal heart sounds. Exam reveals no gallop and no friction rub. No murmur heard.  Pulmonary/Chest: Effort normal and breath sounds normal. No stridor. No respiratory distress. He has no wheezes. He has no rales. He exhibits no tenderness.  Abdominal: Soft. Bowel sounds are normal. He exhibits no distension. There is no tenderness. There is no rebound and no guarding.  Musculoskeletal: Normal  range of motion. He exhibits no edema and no tenderness.  Neurological: He is alert and oriented to person, place, and time. Coordination normal.  Skin: Skin is warm and dry. No rash noted. He is not diaphoretic. No erythema. No pallor.  Psychiatric: He has a normal mood and affect. His behavior is normal. Judgment and thought content normal.       ASSESSMENT AND PLAN

## 2014-06-25 NOTE — Assessment & Plan Note (Signed)
Recommend a target LDL of less than 100.

## 2014-06-25 NOTE — Patient Instructions (Signed)
Your physician has requested that you have a stress echocardiogram. For further information please visit [email or web address]. Please follow instruction sheet as given.  Your physician recommends that you schedule a follow-up appointment as needed with Dr Fletcher Anon.

## 2014-06-25 NOTE — Assessment & Plan Note (Signed)
I had a prolonged discussion with him about the importance of smoking cessation.he wants to quit smoking and thinks he can do it on his own.

## 2014-06-25 NOTE — Assessment & Plan Note (Signed)
Blood pressure is mildly elevated. Continue treatment with losartan.

## 2014-06-28 ENCOUNTER — Ambulatory Visit: Payer: Self-pay | Admitting: Family Medicine

## 2014-07-03 ENCOUNTER — Other Ambulatory Visit (HOSPITAL_COMMUNITY): Payer: BC Managed Care – PPO

## 2014-07-11 ENCOUNTER — Other Ambulatory Visit (HOSPITAL_COMMUNITY): Payer: BC Managed Care – PPO | Admitting: *Deleted

## 2014-07-11 ENCOUNTER — Ambulatory Visit (HOSPITAL_COMMUNITY): Payer: BC Managed Care – PPO | Attending: Cardiovascular Disease

## 2014-07-11 ENCOUNTER — Telehealth: Payer: Self-pay | Admitting: Family Medicine

## 2014-07-11 ENCOUNTER — Other Ambulatory Visit (HOSPITAL_COMMUNITY): Payer: Self-pay | Admitting: Cardiovascular Disease

## 2014-07-11 DIAGNOSIS — R079 Chest pain, unspecified: Secondary | ICD-10-CM | POA: Insufficient documentation

## 2014-07-11 MED ORDER — ATROPINE SULFATE 0.1 MG/ML IJ SOLN
0.5000 mg | Freq: Once | INTRAMUSCULAR | Status: AC
  Administered 2014-07-11: 0.5 mg via INTRAVENOUS

## 2014-07-11 MED ORDER — SODIUM CHLORIDE 0.9 % IV SOLN
40.0000 ug/kg | Freq: Once | INTRAVENOUS | Status: AC
  Administered 2014-07-11: 40 ug/kg/min via INTRAVENOUS

## 2014-07-11 MED ORDER — OXYCODONE-ACETAMINOPHEN 5-325 MG PO TABS: 1.0000 | ORAL_TABLET | Freq: Two times a day (BID) | ORAL | Status: DC | PRN

## 2014-07-11 NOTE — Progress Notes (Signed)
Had to abort exercise stress echo and changed to dobutamine stress echo due to patient's physical deconditioning during the exam. Stress echo with dobutamine completed 07/11/2014

## 2014-07-11 NOTE — Telephone Encounter (Signed)
Gerald West called and would like to come in and pick up his prescription for his pain meds on the morning of 07/12/2014.

## 2014-07-11 NOTE — Telephone Encounter (Signed)
Med refilled advised to schedule an appt. No more refills until he does.Audelia Hives [place]

## 2014-07-15 ENCOUNTER — Encounter: Payer: Self-pay | Admitting: Cardiovascular Disease

## 2014-07-15 NOTE — Telephone Encounter (Signed)
New MSG   Patient calling to get results of lab work please contact at [phone]

## 2014-07-15 NOTE — Telephone Encounter (Signed)
This encounter was created in error - please disregard.

## 2014-08-02 ENCOUNTER — Ambulatory Visit: Payer: BC Managed Care – PPO | Admitting: Family Medicine

## 2014-08-08 ENCOUNTER — Encounter: Payer: Self-pay | Admitting: Family Medicine

## 2014-08-08 ENCOUNTER — Ambulatory Visit (INDEPENDENT_AMBULATORY_CARE_PROVIDER_SITE_OTHER): Payer: BC Managed Care – PPO | Admitting: Family Medicine

## 2014-08-08 VITALS — BP 146/85 | HR 98 | Ht 64.0 in | Wt 214.0 lb

## 2014-08-08 DIAGNOSIS — R7301 Impaired fasting glucose: Secondary | ICD-10-CM | POA: Diagnosis not present

## 2014-08-08 DIAGNOSIS — Z720 Tobacco use: Secondary | ICD-10-CM

## 2014-08-08 DIAGNOSIS — R0789 Other chest pain: Secondary | ICD-10-CM

## 2014-08-08 DIAGNOSIS — K2100 Gastro-esophageal reflux disease with esophagitis, without bleeding: Secondary | ICD-10-CM

## 2014-08-08 LAB — POCT GLYCOSYLATED HEMOGLOBIN (HGB A1C): HEMOGLOBIN A1C: 6.1

## 2014-08-08 MED ORDER — OXYCODONE-ACETAMINOPHEN 5-325 MG PO TABS: 1.0000 | ORAL_TABLET | Freq: Two times a day (BID) | ORAL | Status: DC | PRN

## 2014-08-08 MED ORDER — OMEPRAZOLE 40 MG PO CPDR: 40.0000 mg | DELAYED_RELEASE_CAPSULE | Freq: Every day | ORAL | Status: DC

## 2014-08-08 NOTE — Progress Notes (Signed)
   Subjective:    Patient ID: Gerald West, male    DOB: 1965-10-03, 48 y.o.   MRN: [id number]  HPI Says saw Cardiology for chest pain. Describes as a sharp pain in hte left side of chest after exercise. Says had a nuclear scan and it was normal.   Chronic back pain - he says his pain is stable. No recent exacerbations. But does have chronic daily pain. Is requesting a refill on his Percocet. He gets 70 tabs per 30 days.  IFG - no increased thirst or urination. He says is actually really tried to cut back on soda.  Tobacco abuse-he knows he needs to quit smoking. He was recently evaluated for chest pain. He says he and his wife are planning on quitting together. He would like to quit without any assistance such as medications or patches etc.  Review of Systems     Objective:   Physical Exam  Constitutional: He is oriented to person, place, and time. He appears well-developed and well-nourished.  HENT:  Head: Normocephalic and atraumatic.  Cardiovascular: Normal rate, regular rhythm and normal heart sounds.   Pulmonary/Chest: Effort normal and breath sounds normal.  Neurological: He is alert and oriented to person, place, and time.  Skin: Skin is warm and dry.  Psychiatric: He has a normal mood and affect. His behavior is normal.          Assessment & Plan:  Atypical CP - his symptoms were most likely felt to be related to reflux and muscle strain. He has been taking some over-the-counter Prilosec 20 mg and it does seem to help but it's been very costly and wants and if this could be sent as a prescription.  IFG - A1C is 6.1 today. Slightly improved from previous. Continue work on diet and exercise and weight loss. We'll repeat in 6 months.  Tob abuse - discussed strategies to quit. He wants to quit cold [place]. Offered assistance with medications etc. He will think about it and let me know. I think if he and his wife can quit the same time and support each other that would be  fantastic. Also reminded him of the long-term effects of smoking.  GERD- we'll treat with omeprazole 40 mg daily for 2 months. After that if he is doing well then we can wean down to every other day. Discussed foods to avoid that can surly trigger and aggravate reflux symptoms.

## 2014-08-08 NOTE — Patient Instructions (Signed)
Smoking Cessation, Tips for Success  If you are ready to quit smoking, congratulations! You have chosen to help yourself be healthier. Cigarettes bring nicotine, tar, carbon monoxide, and other irritants into your body. Your lungs, heart, and blood vessels will be able to work better without these poisons. There are many different ways to quit smoking. Nicotine gum, nicotine patches, a nicotine inhaler, or nicotine nasal spray can help with physical craving. Hypnosis, support groups, and medicines help break the habit of smoking.  WHAT THINGS CAN I DO TO MAKE QUITTING EASIER?   Here are some tips to help you quit for good:  · Pick a date when you will quit smoking completely. Tell all of your friends and family about your plan to quit on that date.  · Do not try to slowly cut down on the number of cigarettes you are smoking. Pick a quit date and quit smoking completely starting on that day.  · Throw away all cigarettes.    · Clean and remove all ashtrays from your home, work, and car.  · On a card, write down your reasons for quitting. Carry the card with you and read it when you get the urge to smoke.  · Cleanse your body of nicotine. Drink enough water and fluids to keep your urine clear or pale yellow. Do this after quitting to flush the nicotine from your body.  · Learn to predict your moods. Do not let a bad situation be your excuse to have a cigarette. Some situations in your life might tempt you into wanting a cigarette.  · Never have "just one" cigarette. It leads to wanting another and another. Remind yourself of your decision to quit.  · Change habits associated with smoking. If you smoked while driving or when feeling stressed, try other activities to replace smoking. Stand up when drinking your coffee. Brush your teeth after eating. Sit in a different chair when you read the paper. Avoid alcohol while trying to quit, and try to drink fewer caffeinated beverages. Alcohol and caffeine may urge you to  smoke.  · Avoid foods and drinks that can trigger a desire to smoke, such as sugary or spicy foods and alcohol.  · Ask people who smoke not to smoke around you.  · Have something planned to do right after eating or having a cup of coffee. For example, plan to take a walk or exercise.  · Try a relaxation exercise to calm you down and decrease your stress. Remember, you may be tense and nervous for the first 2 weeks after you quit, but this will pass.  · Find new activities to keep your hands busy. Play with a pen, coin, or rubber band. Doodle or draw things on paper.  · Brush your teeth right after eating. This will help cut down on the craving for the taste of tobacco after meals. You can also try mouthwash.    · Use oral substitutes in place of cigarettes. Try using lemon drops, carrots, cinnamon sticks, or chewing gum. Keep them handy so they are available when you have the urge to smoke.  · When you have the urge to smoke, try deep breathing.  · Designate your home as a nonsmoking area.  · If you are a heavy smoker, ask your health care provider about a prescription for nicotine chewing gum. It can ease your withdrawal from nicotine.  · Reward yourself. Set aside the cigarette money you save and buy yourself something nice.  · Look for   support from others. Join a support group or smoking cessation program. Ask someone at home or at work to help you with your plan to quit smoking.  · Always ask yourself, "Do I need this cigarette or is this just a reflex?" Tell yourself, "Today, I choose not to smoke," or "I do not want to smoke." You are reminding yourself of your decision to quit.  · Do not replace cigarette smoking with electronic cigarettes (commonly called e-cigarettes). The safety of e-cigarettes is unknown, and some may contain harmful chemicals.  · If you relapse, do not give up! Plan ahead and think about what you will do the next time you get the urge to smoke.  HOW WILL I FEEL WHEN I QUIT SMOKING?  You  may have symptoms of withdrawal because your body is used to nicotine (the addictive substance in cigarettes). You may crave cigarettes, be irritable, feel very hungry, cough often, get headaches, or have difficulty concentrating. The withdrawal symptoms are only temporary. They are strongest when you first quit but will go away within 10-14 days. When withdrawal symptoms occur, stay in control. Think about your reasons for quitting. Remind yourself that these are signs that your body is healing and getting used to being without cigarettes. Remember that withdrawal symptoms are easier to treat than the major diseases that smoking can cause.   Even after the withdrawal is over, expect periodic urges to smoke. However, these cravings are generally short lived and will go away whether you smoke or not. Do not smoke!  WHAT RESOURCES ARE AVAILABLE TO HELP ME QUIT SMOKING?  Your health care provider can direct you to community resources or hospitals for support, which may include:  · Group support.  · Education.  · Hypnosis.  · Therapy.  Document Released: 05/07/2004 Document Revised: 12/24/2013 Document Reviewed: 01/25/2013  ExitCare® Patient Information ©2015 [employer]. This information is not intended to replace advice given to you by your health care provider. Make sure you discuss any questions you have with your health care provider.

## 2014-08-18 ENCOUNTER — Other Ambulatory Visit: Payer: Self-pay | Admitting: Family Medicine

## 2014-09-06 ENCOUNTER — Other Ambulatory Visit: Payer: Self-pay | Admitting: Family Medicine

## 2014-09-06 MED ORDER — OXYCODONE-ACETAMINOPHEN 5-325 MG PO TABS: 1.0000 | ORAL_TABLET | Freq: Two times a day (BID) | ORAL | Status: DC | PRN

## 2014-09-06 NOTE — Telephone Encounter (Signed)
Patient called and wanted to request his script for percocet 5-325.  He is off on Monday and would like to pick it up Monday morning.  thanks

## 2014-09-06 NOTE — Telephone Encounter (Signed)
rx printed and placed up front for p/u on Monday. Pt advised to make f/u appt some time around(11/07/14) . Pt will call back to schedule.Audelia Hives [place]

## 2014-10-11 ENCOUNTER — Other Ambulatory Visit: Payer: Self-pay | Admitting: *Deleted

## 2014-10-11 ENCOUNTER — Other Ambulatory Visit: Payer: Self-pay | Admitting: Family Medicine

## 2014-10-11 ENCOUNTER — Encounter: Payer: Self-pay | Admitting: Family Medicine

## 2014-10-11 ENCOUNTER — Ambulatory Visit (INDEPENDENT_AMBULATORY_CARE_PROVIDER_SITE_OTHER): Payer: BLUE CROSS/BLUE SHIELD | Admitting: Family Medicine

## 2014-10-11 VITALS — BP 158/82 | HR 97 | Wt 214.0 lb

## 2014-10-11 DIAGNOSIS — M545 Low back pain, unspecified: Secondary | ICD-10-CM

## 2014-10-11 DIAGNOSIS — I10 Essential (primary) hypertension: Secondary | ICD-10-CM

## 2014-10-11 DIAGNOSIS — Z79899 Other long term (current) drug therapy: Secondary | ICD-10-CM | POA: Diagnosis not present

## 2014-10-11 DIAGNOSIS — Z720 Tobacco use: Secondary | ICD-10-CM

## 2014-10-11 MED ORDER — OXYCODONE-ACETAMINOPHEN 5-325 MG PO TABS: 1.0000 | ORAL_TABLET | Freq: Two times a day (BID) | ORAL | Status: DC | PRN

## 2014-10-11 NOTE — Progress Notes (Signed)
   Subjective:    Patient ID: Gerald West, male    DOB: November 14, 1965, 50 y.o.   MRN: [id number]  HPI Hypertension- Pt denies chest pain, SOB, dizziness, or heart palpitations.  Taking meds as directed w/o problems.  Denies medication side effects.  Did forget his meds this morning.   Low back pain w/out sciatica - doing well on his pain medication. Due for refill today.  No recent flares.  Today back pain is 5/10. Varies depending on activities.    Tobacco abuse-discussed quitting smoking at the last office visit. He was quite interested at that time. He says he is not completely ready to quit but has cut back.  Review of Systems     Objective:   Physical Exam  Constitutional: He is oriented to person, place, and time. He appears well-developed and well-nourished.  HENT:  Head: Normocephalic and atraumatic.  Cardiovascular: Normal rate, regular rhythm and normal heart sounds.   Pulmonary/Chest: Effort normal and breath sounds normal.  Neurological: He is alert and oriented to person, place, and time.  Skin: Skin is warm and dry.  Psychiatric: He has a normal mood and affect. His behavior is normal.          Assessment & Plan:  HTN - Uncontrolled.  Reminded of the importance of being very consistent with his medications. He says he will work on this and try to improve that. Keep regularly scheduled follow-up for blood pressure.  Low back pain- discussed need for weight loss.  Due for UDS.  Discussed the importance of diet and exercise.  Tobacco abuse-encourage cessation. I am glad that he has cut back.

## 2014-10-12 LAB — DRUG SCR UR, PAIN MGMT, REFLEX CONF
AMPHETAMINE SCRN UR: NEGATIVE
BARBITURATE QUANT UR: NEGATIVE
Benzodiazepines.: NEGATIVE
Cocaine Metabolites: NEGATIVE
Creatinine,U: 40.53 mg/dL
Marijuana Metabolite: NEGATIVE
Methadone: NEGATIVE
Opiates: NEGATIVE
Phencyclidine (PCP): NEGATIVE
Propoxyphene: NEGATIVE

## 2014-10-18 LAB — PMP OPIATES W/CONF, UR

## 2014-10-21 LAB — OPIATES/OPIOIDS (LC/MS-MS)
Codeine Urine: NEGATIVE ng/mL (ref <50)
HYDROMORPHONE: NEGATIVE ng/mL (ref <50)
Hydrocodone: NEGATIVE ng/mL (ref <50)
MORPHINE: NEGATIVE ng/mL (ref <50)
Norhydrocodone, Ur: NEGATIVE ng/mL (ref <50)
Noroxycodone, Ur: 1110 ng/mL — ABNORMAL HIGH (ref <50)
OXYCODONE, UR: 2445 ng/mL — AB (ref <50)
Oxymorphone: 2304 ng/mL — ABNORMAL HIGH (ref <50)

## 2014-10-21 LAB — CANNABANOIDS (GC/LC/MS), URINE: THC-COOH UR CONFIRM: NEGATIVE ng/mL (ref <5)

## 2014-11-06 ENCOUNTER — Telehealth: Payer: Self-pay | Admitting: Family Medicine

## 2014-11-06 NOTE — Telephone Encounter (Signed)
Patient called 48 hours ahead of time so he could pick up his pain med that is due on Friday 11-08-14, He states he will come early Friday morning before work to pick up the script at the front desk. Thanks, [employer]

## 2014-11-07 ENCOUNTER — Other Ambulatory Visit: Payer: Self-pay | Admitting: Family Medicine

## 2014-11-08 MED ORDER — OXYCODONE-ACETAMINOPHEN 5-325 MG PO TABS: 1.0000 | ORAL_TABLET | Freq: Two times a day (BID) | ORAL | Status: DC | PRN

## 2014-11-08 NOTE — Telephone Encounter (Signed)
Printed prescription and will place up front for pick up.

## 2014-12-04 ENCOUNTER — Telehealth: Payer: Self-pay | Admitting: Family Medicine

## 2014-12-04 DIAGNOSIS — E785 Hyperlipidemia, unspecified: Secondary | ICD-10-CM

## 2014-12-04 DIAGNOSIS — I10 Essential (primary) hypertension: Secondary | ICD-10-CM

## 2014-12-04 NOTE — Telephone Encounter (Signed)
Please call patient: He really needs to go to get his cholesterol drawn. We did order this him as to year ago and I still don't see the result in the system.

## 2014-12-04 NOTE — Telephone Encounter (Signed)
Pt informed. He said he would come in next wk to have this done. Lab ordered and faxed.Audelia Hives [place]

## 2014-12-09 ENCOUNTER — Other Ambulatory Visit: Payer: Self-pay | Admitting: *Deleted

## 2014-12-09 ENCOUNTER — Telehealth: Payer: Self-pay | Admitting: Family Medicine

## 2014-12-09 ENCOUNTER — Ambulatory Visit: Payer: BLUE CROSS/BLUE SHIELD | Admitting: Family Medicine

## 2014-12-09 MED ORDER — OXYCODONE-ACETAMINOPHEN 5-325 MG PO TABS: 1.0000 | ORAL_TABLET | Freq: Two times a day (BID) | ORAL | Status: DC | PRN

## 2014-12-09 NOTE — Telephone Encounter (Signed)
Patient would like to pick his Percocet script tomorrow 12-10-14 and was just calling a ahead so it will be ready for pick on Tuesday. He also plans to get his labs done on Thursday. Thanks

## 2014-12-10 NOTE — Telephone Encounter (Signed)
Done

## 2014-12-20 ENCOUNTER — Ambulatory Visit: Payer: BLUE CROSS/BLUE SHIELD | Admitting: Family Medicine

## 2015-01-08 ENCOUNTER — Ambulatory Visit (INDEPENDENT_AMBULATORY_CARE_PROVIDER_SITE_OTHER): Payer: BLUE CROSS/BLUE SHIELD | Admitting: Family Medicine

## 2015-01-08 ENCOUNTER — Encounter: Payer: Self-pay | Admitting: Family Medicine

## 2015-01-08 ENCOUNTER — Other Ambulatory Visit: Payer: Self-pay | Admitting: Family Medicine

## 2015-01-08 VITALS — BP 157/96 | HR 86 | Ht 64.0 in | Wt 202.0 lb

## 2015-01-08 DIAGNOSIS — I10 Essential (primary) hypertension: Secondary | ICD-10-CM | POA: Diagnosis not present

## 2015-01-08 DIAGNOSIS — Z114 Encounter for screening for human immunodeficiency virus [HIV]: Secondary | ICD-10-CM

## 2015-01-08 DIAGNOSIS — R110 Nausea: Secondary | ICD-10-CM

## 2015-01-08 DIAGNOSIS — G8929 Other chronic pain: Secondary | ICD-10-CM

## 2015-01-08 DIAGNOSIS — M545 Low back pain: Secondary | ICD-10-CM

## 2015-01-08 DIAGNOSIS — Z720 Tobacco use: Secondary | ICD-10-CM

## 2015-01-08 MED ORDER — LOSARTAN POTASSIUM 100 MG PO TABS: 100.0000 mg | ORAL_TABLET | Freq: Every day | ORAL | Status: DC

## 2015-01-08 MED ORDER — OMEPRAZOLE 40 MG PO CPDR: 40.0000 mg | DELAYED_RELEASE_CAPSULE | Freq: Every day | ORAL | Status: DC

## 2015-01-08 MED ORDER — OXYCODONE-ACETAMINOPHEN 5-325 MG PO TABS: 1.0000 | ORAL_TABLET | Freq: Two times a day (BID) | ORAL | Status: DC | PRN

## 2015-01-08 NOTE — Patient Instructions (Signed)
folow up with me in 2 months.

## 2015-01-08 NOTE — Addendum Note (Signed)
Addended by: Beatrice Lecher D on: 01/08/2015 10:08 AM   Modules accepted: Level of Service

## 2015-01-08 NOTE — Progress Notes (Signed)
   Subjective:    Patient ID: Gerald West, male    DOB: 02/06/1966, 49 y.o.   MRN: [id number]  HPI Hypertension- Pt denies chest pain, SOB, dizziness, or heart palpitations.  Taking meds as directed w/o problems.  Denies medication side effects.  He has been out of his blood pressure medications for a couple of weeks. He says he has not gone for the blood work but knows he needs to.   Upset stomach today and thinks ate some bad fish yesterday. Needs refill on reflux medicaiton.   Says having dental problems and needs a root canal.  The he is in some pain today.  Chronic low back pain on chronic narcotic pain management-due for refill today. Pain is stable. No recent flares or exacerbations.  Review of Systems     Objective:   Physical Exam  Constitutional: He is oriented to person, place, and time. He appears well-developed and well-nourished.  HENT:  Head: Normocephalic and atraumatic.  Cardiovascular: Normal rate, regular rhythm and normal heart sounds.   Pulmonary/Chest: Effort normal and breath sounds normal.  Neurological: He is alert and oriented to person, place, and time.  Skin: Skin is warm and dry.  Psychiatric: He has a normal mood and affect. His behavior is normal.          Assessment & Plan:  HTN - Uncontrolled. rEstart medication. Discussed with him the importance of taking his medication regularly. Reminded him that the last 4 times that he has been here his blood pressure has been uncontrolled because he has not taken his medication. This is extremely important. We'll see him back in one month for nurse blood pressure and weight check.  GI upset, most likely secondary to contaminated food. He started feeling a little bit better today. Next  Chronic low back pain-refilled his Percocet today. Next  Tobacco abuse-he says he's cut down to 9 cigarettes per day. Congratulated him on this and encouraged him to continue to work at.  Impaired fasting glucose-due to  recheck his A1c next month.

## 2015-01-18 IMAGING — CR DG CHEST 2V
2 series · 2 of 2 positions shown · non-contrast
Comparison: 08/25/2013

CLINICAL DATA: Chest pain for several days

EXAM:
CHEST  2 VIEW

[w chest pa]
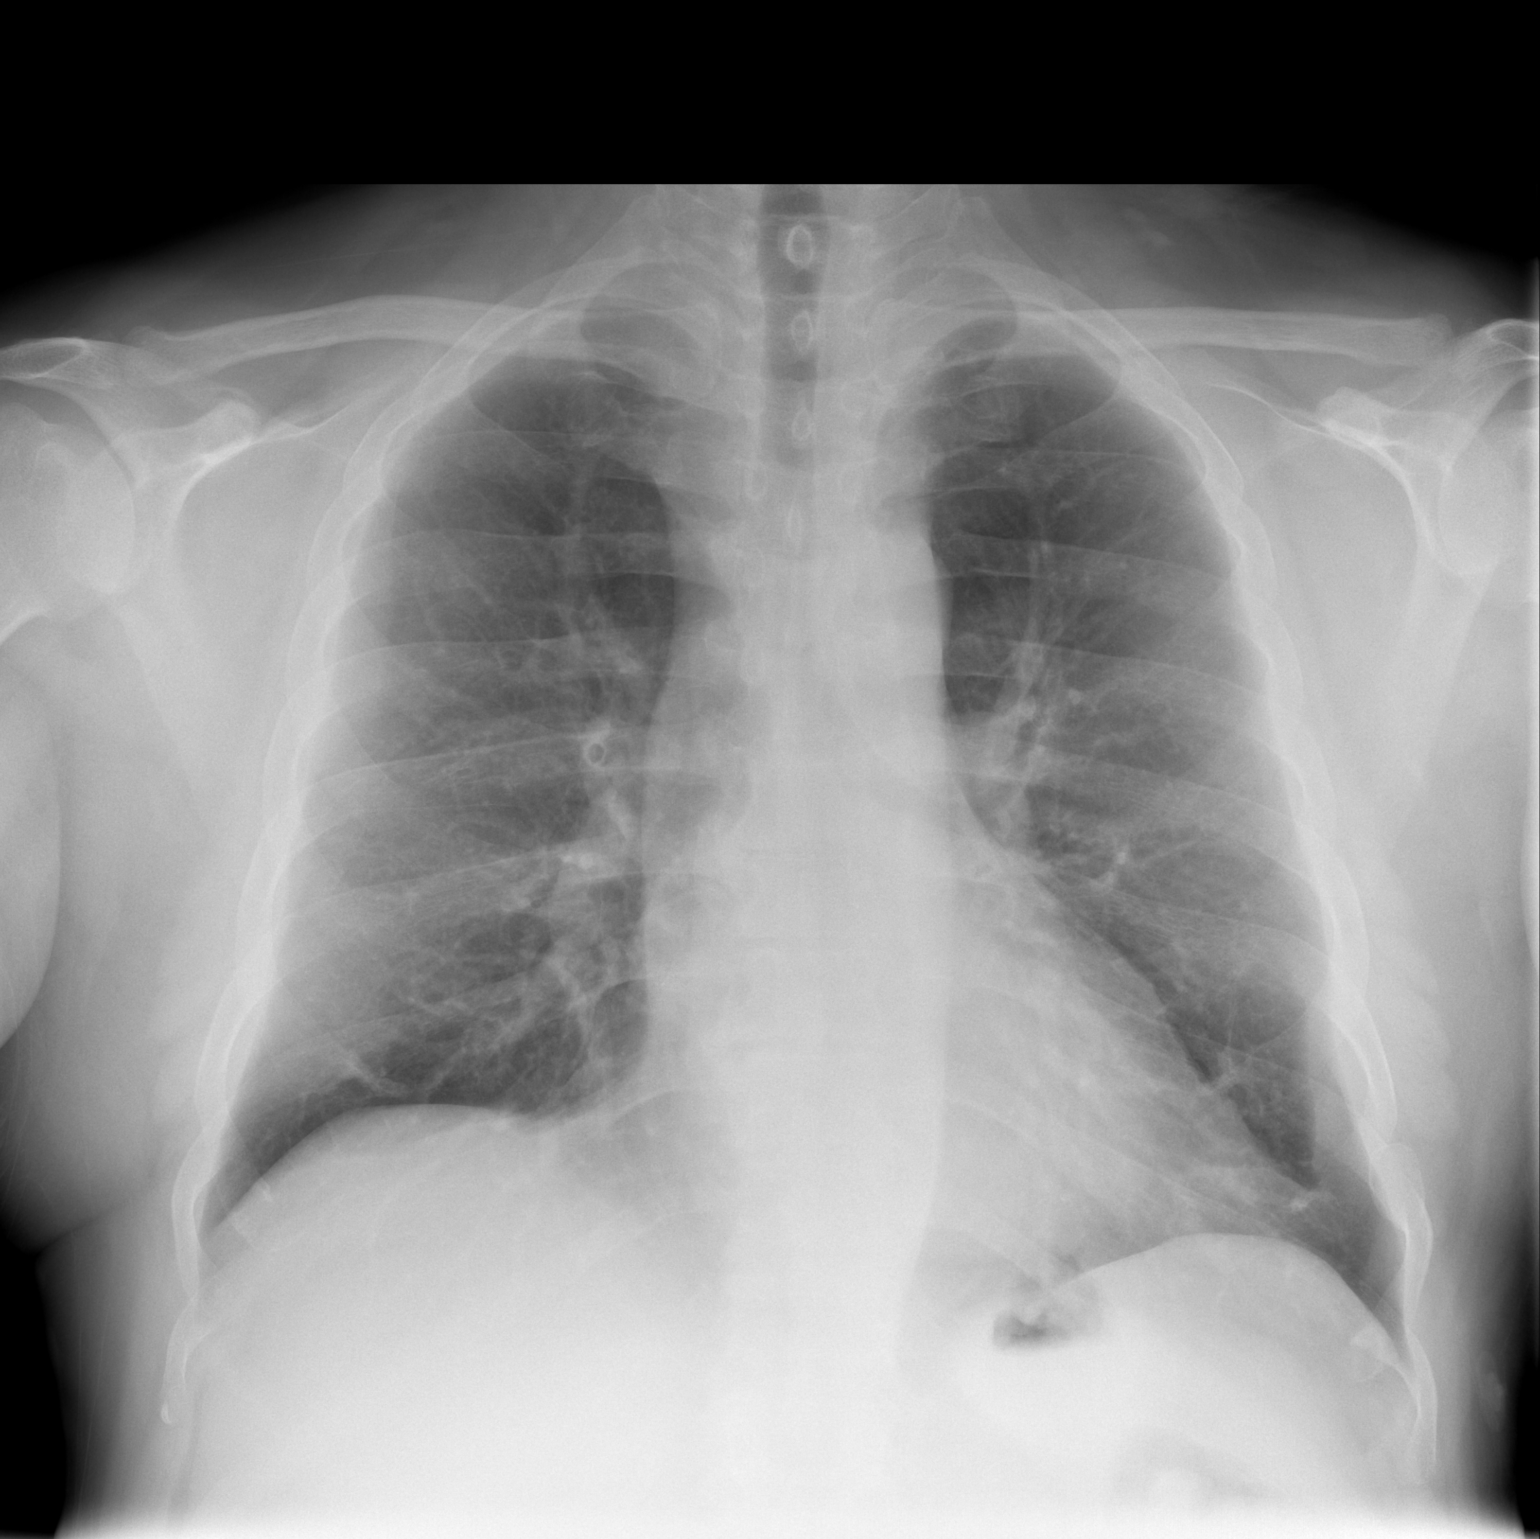

[w chest lat]
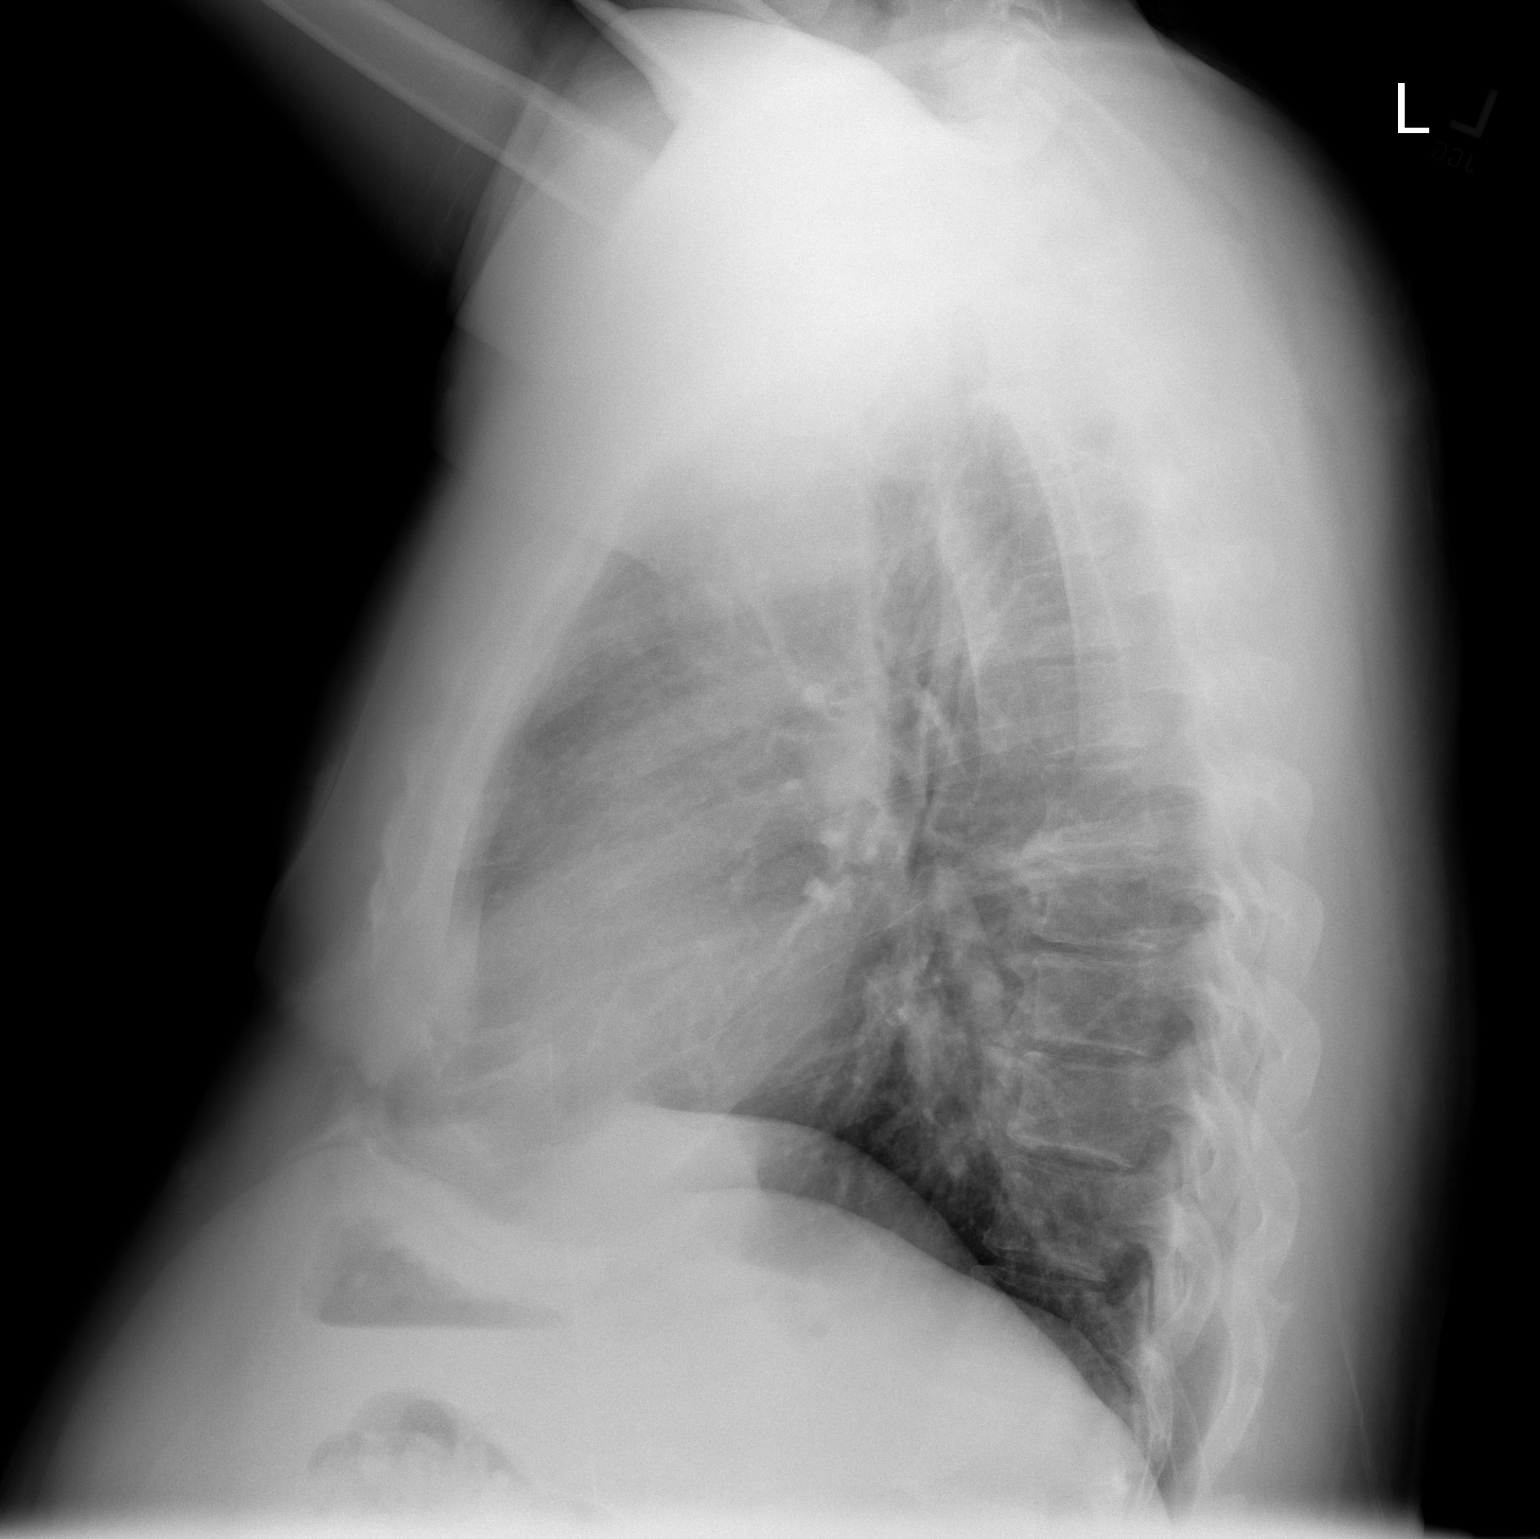

[2 of 2 positions shown; findings below may reference images not displayed]

FINDINGS: Chronic left basilar and lateral rib deformity. Clear lungs. No
pneumothorax or pleural effusion. No vertebral compression. Normal
heart size. Normal vascularity.
IMPRESSION: No active cardiopulmonary disease.

## 2015-02-06 ENCOUNTER — Telehealth: Payer: Self-pay | Admitting: Family Medicine

## 2015-02-06 NOTE — Telephone Encounter (Signed)
Pt is calling ahead since his script runs out Saturday. He would like to come by and pick up script tomorrow for his pain med.... Thanks, Delsa Sale

## 2015-02-07 ENCOUNTER — Other Ambulatory Visit: Payer: Self-pay | Admitting: Family Medicine

## 2015-02-07 MED ORDER — OXYCODONE-ACETAMINOPHEN 5-325 MG PO TABS: 1.0000 | ORAL_TABLET | Freq: Two times a day (BID) | ORAL | Status: DC | PRN

## 2015-02-07 NOTE — Telephone Encounter (Signed)
Rx printed and Pt aware to pick up.  

## 2015-03-06 ENCOUNTER — Telehealth: Payer: Self-pay | Admitting: Family Medicine

## 2015-03-06 MED ORDER — OXYCODONE-ACETAMINOPHEN 5-325 MG PO TABS: 1.0000 | ORAL_TABLET | Freq: Two times a day (BID) | ORAL | Status: DC | PRN

## 2015-03-06 NOTE — Telephone Encounter (Signed)
Patient is due for his pain med on Monday July 18th and wants to know if he can pick it up tomorrow Friday July 15th cause he is leaving for the beach on Sunday. He's says its totally fine if you date it for the 18th as usual. Call patient and let him know if he can pick up at the office.? Thanks, [employer]

## 2015-03-06 NOTE — Telephone Encounter (Signed)
Prescription will be ready for pick up tomorrow. Left message on voicemail for pick up.

## 2015-03-07 ENCOUNTER — Ambulatory Visit (INDEPENDENT_AMBULATORY_CARE_PROVIDER_SITE_OTHER): Payer: BLUE CROSS/BLUE SHIELD | Admitting: Family Medicine

## 2015-03-07 VITALS — BP 155/96 | HR 81

## 2015-03-07 DIAGNOSIS — I10 Essential (primary) hypertension: Secondary | ICD-10-CM

## 2015-03-07 MED ORDER — HYDROCHLOROTHIAZIDE 25 MG PO TABS: 25.0000 mg | ORAL_TABLET | Freq: Every day | ORAL | Status: DC

## 2015-03-07 NOTE — Progress Notes (Signed)
   Subjective:    Patient ID: Gerald West, male    DOB: 1966/04/25, 49 y.o.   MRN: [id number]  HPI Patient came into clinic for nurse visit BP check. Pt repots taking his BP Rx everyday and took it about 0800 today.   Review of Systems     Objective:   Physical Exam        Assessment & Plan:  Patient's BP was elevated today, states he has been taking his Rx so he doesn't know why its high. Reports taking his Rx everyday but not always at the same time. Pt felt if he took the Rx then it would just stay in his system. Went over details regarding taking the Rx around the same time everyday in increase Rx effectiveness. Will route to PCP for review.   Hypertension-uncontrolled. Will start HCTZ in addition to his losartan that he already takes. And follow-up with me in 6 weeks for blood pressure check.  Beatrice Lecher, MD

## 2015-03-07 NOTE — Progress Notes (Signed)
Left information on Pt's voicemail, provided callback for any further questions.

## 2015-04-09 ENCOUNTER — Telehealth: Payer: Self-pay | Admitting: Family Medicine

## 2015-04-09 NOTE — Telephone Encounter (Signed)
Patient called and wants to come pick up his Percocet Prescription Tomorrow afternoon and he has a f/u appt scheduled with Dr.Metheney Friday  04/18/15 at 10:15

## 2015-04-10 ENCOUNTER — Ambulatory Visit: Payer: BLUE CROSS/BLUE SHIELD | Admitting: Family Medicine

## 2015-04-10 MED ORDER — OXYCODONE-ACETAMINOPHEN 5-325 MG PO TABS: 1.0000 | ORAL_TABLET | Freq: Two times a day (BID) | ORAL | Status: DC | PRN

## 2015-04-10 NOTE — Telephone Encounter (Signed)
Left message for Pt advising Rx was ready.

## 2015-04-10 NOTE — Telephone Encounter (Signed)
Need nurse visit for UDS when pick up Rx.

## 2015-04-11 ENCOUNTER — Other Ambulatory Visit: Payer: Self-pay | Admitting: Family Medicine

## 2015-04-11 DIAGNOSIS — R52 Pain, unspecified: Secondary | ICD-10-CM

## 2015-04-12 LAB — DRUG SCREEN, URINE
AMPHETAMINE SCRN UR: NEGATIVE
Barbiturate Quant, Ur: NEGATIVE
Benzodiazepines.: NEGATIVE
COCAINE METABOLITES: NEGATIVE
CREATININE, U: 93.94 mg/dL
Marijuana Metabolite: NEGATIVE
Methadone: NEGATIVE
Opiates: POSITIVE — AB
Phencyclidine (PCP): NEGATIVE
Propoxyphene: NEGATIVE

## 2015-04-18 ENCOUNTER — Ambulatory Visit: Payer: BLUE CROSS/BLUE SHIELD | Admitting: Family Medicine

## 2015-05-02 ENCOUNTER — Ambulatory Visit: Payer: BLUE CROSS/BLUE SHIELD | Admitting: Family Medicine

## 2015-05-09 ENCOUNTER — Encounter: Payer: Self-pay | Admitting: Family Medicine

## 2015-05-09 ENCOUNTER — Ambulatory Visit (INDEPENDENT_AMBULATORY_CARE_PROVIDER_SITE_OTHER): Payer: BLUE CROSS/BLUE SHIELD | Admitting: Family Medicine

## 2015-05-09 ENCOUNTER — Telehealth: Payer: Self-pay | Admitting: Family Medicine

## 2015-05-09 VITALS — BP 138/85 | HR 100 | Wt 202.0 lb

## 2015-05-09 DIAGNOSIS — R1030 Lower abdominal pain, unspecified: Secondary | ICD-10-CM

## 2015-05-09 DIAGNOSIS — K429 Umbilical hernia without obstruction or gangrene: Secondary | ICD-10-CM | POA: Diagnosis not present

## 2015-05-09 DIAGNOSIS — R1032 Left lower quadrant pain: Secondary | ICD-10-CM

## 2015-05-09 DIAGNOSIS — I10 Essential (primary) hypertension: Secondary | ICD-10-CM

## 2015-05-09 MED ORDER — OXYCODONE-ACETAMINOPHEN 5-325 MG PO TABS: 1.0000 | ORAL_TABLET | Freq: Two times a day (BID) | ORAL | Status: DC | PRN

## 2015-05-09 MED ORDER — LOSARTAN POTASSIUM-HCTZ 100-25 MG PO TABS: 1.0000 | ORAL_TABLET | Freq: Every day | ORAL | Status: DC

## 2015-05-09 NOTE — Telephone Encounter (Signed)
Please call patient: I forgot to let him know that his urine drug screen was positive for marijuana. He cannot be taking any other substances well on the pain medications that I am prescribing. I will continue to prescribe them for now but if he has a positive test in the future and we will no longer be able to provide that medication prescription for him.

## 2015-05-09 NOTE — Progress Notes (Addendum)
   Subjective:    Patient ID: Gerald West, male    DOB: 15-Sep-1965, 49 y.o.   MRN: [id number]  HPI Hypertension- Pt denies chest pain, SOB, dizziness, or heart palpitations.  Taking meds as directed w/o problems.  Denies medication side effects.  Added hydrochlorothiazide to his regimen about 2 months ago.  Left groin pain  - has been going on for about a year. It comes on and off. Sometimes it's worse with certain movements. He denies any swelling. The pain is directly over the left groin crease and occasionally radiates towards the testicle. He denies any known injury or trauma  Umbilical hernia - he does occasionally is tender. He denies any hardness or feeling like a knot. It's been there for about a year and a half to 2 years.  Chronic low back pain-stable on current regimen. No recent flares or exacerbations with his back. He said right now the groin crease pain is bothering him more than his back. He is due for refill on Sunday.   Review of Systems     Objective:   Physical Exam  Constitutional: He is oriented to person, place, and time. He appears well-developed and well-nourished.  HENT:  Head: Normocephalic and atraumatic.  Cardiovascular: Normal rate, regular rhythm and normal heart sounds.   Pulmonary/Chest: Effort normal and breath sounds normal.  Abdominal: Soft. Bowel sounds are normal. He exhibits no distension and no mass. There is tenderness. There is no rebound and no guarding.  approx 1 cm oval shaped umbilical hernia.    Genitourinary:  Unable to palpate a left groin hernia.  But does have some tnderness. No swelling   Neurological: He is alert and oriented to person, place, and time.  Skin: Skin is warm and dry.  Psychiatric: He has a normal mood and affect. His behavior is normal.          Assessment & Plan:  Hypertension-well-controlled. Continue current regimen.we'll continue to monitor.due for CMP and lipid panel. Lab slip printed today.  Left groin  pain - I was not able to palpated distinct hernia but certainly with the pain he is describing it's very possible. Also consider point strain. Will have the surgeon take a look at it as well. They don't feel like it's a groin hernia then consider ultrasound for further evaluation. Consider scrotal ultrasound as well since the pain does radiate towards the testicle.  Umbilical pain - he does have an umbilical hernia on exam today. Will refer to general surgery for consult for possible correction.  Chronic back pain-will refill hydrocodone today. Last rx was pos for marijuana.  If pos again we will no longer prescribe narcotics form our office.

## 2015-05-22 NOTE — Telephone Encounter (Signed)
Unable to reach patient , mailbox is full.

## 2015-06-04 ENCOUNTER — Telehealth: Payer: Self-pay

## 2015-06-04 MED ORDER — OXYCODONE-ACETAMINOPHEN 5-325 MG PO TABS: 1.0000 | ORAL_TABLET | Freq: Two times a day (BID) | ORAL | Status: DC | PRN

## 2015-06-04 NOTE — Telephone Encounter (Signed)
lvm for pt to rtn call.Gerald West Gerald West  

## 2015-06-04 NOTE — Telephone Encounter (Signed)
Pt returned call states he's not had a chance to pick up current RX but he still has some from last refill. Pt states he is taking his medications but has missed some days.

## 2015-06-04 NOTE — Telephone Encounter (Signed)
Attempted to contact pt for medication concerns from insurance. Left msg to call back.

## 2015-06-04 NOTE — Telephone Encounter (Signed)
Pt called back and informed of recommendations. rx refilled .Gerald West [place]

## 2015-06-24 ENCOUNTER — Ambulatory Visit: Payer: Self-pay | Admitting: General Surgery

## 2015-06-24 NOTE — H&P (Signed)
History of Present Illness Ralene Ok MD; 05/20/2015 2:27 PM) The patient is a 49 year old male who presents with an umbilical hernia. Patient is a 49 year old male who is referred by catheter Matheny for evaluation of an umbilical hernia. Patient states that this is been here for about 2 years. He states his become more tender. The patient states that it's more cumbersome when playing golf. Patient also has a area of tenderness the left groin crease. He states it radiates down to the left testicle. He has not noticed any bulge the left side.   Other Problems Elbert Ewings, CMA; 05/20/2015 2:11 PM) Back Pain Depression Gastroesophageal Reflux Disease Hemorrhoids High blood pressure Hypercholesterolemia  Diagnostic Studies History Elbert Ewings, CMA; 05/20/2015 2:11 PM) Colonoscopy 1-5 years ago  Allergies Elbert Ewings, CMA; 05/20/2015 2:12 PM) ACE Bandage Self-Adhering *MEDICAL DEVICES AND SUPPLIES*  Medication History Elbert Ewings, CMA; 05/20/2015 2:13 PM) Losartan Potassium-HCTZ (100-25MG  Tablet, Oral) Active. Potassium (99MG  Tablet, Oral) Active. Medications Reconciled  Social History Elbert Ewings, [place]; 05/20/2015 2:11 PM) Alcohol use Moderate alcohol use. Caffeine use Carbonated beverages, Coffee, Tea. Tobacco use Current every day smoker.    Review of Systems Elbert Ewings CMA; 05/20/2015 2:11 PM) Gastrointestinal Present- Abdominal Pain. Not Present- Bloating, Bloody Stool, Change in Bowel Habits, Chronic diarrhea, Constipation, Difficulty Swallowing, Excessive gas, Gets full quickly at meals, Hemorrhoids, Indigestion, Nausea, Rectal Pain and Vomiting.  Vitals Elbert Ewings CMA; 05/20/2015 2:13 PM) 05/20/2015 2:13 PM Weight: 201 lb Height: 65in Body Surface Area: 2.04 m Body Mass Index: 33.45 kg/m  Temp.: 97.56F(Temporal)  Pulse: 90 (Regular)  BP: 130/70 (Sitting, Left Arm, Standard)     Physical Exam Ralene Ok MD; 05/20/2015 2:27  PM) General Mental Status-Alert. General Appearance-Consistent with stated age. Hydration-Well hydrated. Voice-Normal.  Head and Neck Head-normocephalic, atraumatic with no lesions or palpable masses. Trachea-midline. Thyroid Gland Characteristics - normal size and consistency.  Chest and Lung Exam Chest and lung exam reveals -quiet, even and easy respiratory effort with no use of accessory muscles and on auscultation, normal breath sounds, no adventitious sounds and normal vocal resonance. Inspection Chest Wall - Normal. Back - normal.  Cardiovascular Cardiovascular examination reveals -normal heart sounds, regular rate and rhythm with no murmurs and normal pedal pulses bilaterally.  Abdomen Inspection Skin - Scar - no surgical scars. Hernias - Umbilical hernia - Reducible(Small approximate 1 cm tender umbilical hernia). Inguinal hernia - Left - Note: No palpable hernia on Valsalva. Palpation/Percussion Normal exam - Soft, Non Tender, No Rebound tenderness, No Rigidity (guarding) and No hepatosplenomegaly. Auscultation Normal exam - Bowel sounds normal.    Assessment & Plan Ralene Ok MD; 02/17/3150 7:61 PM) UMBILICAL HERNIA WITHOUT OBSTRUCTION AND WITHOUT GANGRENE (K42.9) Impression: 49 year old male with an umbilical hernia approximate 1 cm.  The patient will like to proceed to the operative for a laparoscopic umbilical hernia repair with mesh. Also discussed the patient we will proceed to explore the left side intraperitoneally if there is any hernia to the left inguinal area we'll proceed with T APP repair. All risks and benefits were discussed with the patient to generally include, but not limited to: infection, bleeding, damage to surrounding structures, acute and chronic nerve pain, and recurrence. Alternatives were offered and described. All questions were answered and the patient voiced understanding of the procedure and wishes to proceed at this  point with hernia repair.  LEFT INGUINAL HERNIA (K40.90) Impression: 49 year old male with a left inguinal crease pain.  I discussed with him well fixed and his  laparoscopic scopic umbilical hernia repair we will explore the left side intra-abdominally. Additionally hernia in place we'll fix it with mesh.

## 2015-06-25 ENCOUNTER — Other Ambulatory Visit (HOSPITAL_COMMUNITY): Payer: BLUE CROSS/BLUE SHIELD

## 2015-06-25 NOTE — Patient Instructions (Signed)
Gerald West  06/25/2015   Your procedure is scheduled on: 07/04/2015    Report to [facility] Main  Entrance take Gerald West  elevators to 3rd floor to  [place] at   0730  AM.  Call this number if you have problems the morning of surgery [phone]   Remember: ONLY 1 PERSON MAY GO WITH YOU TO SHORT STAY TO GET  READY MORNING OF Gerald West.  Do not eat food or drink liquids :After Midnight.     Take these medicines the morning of surgery with A SIP OF WATER:  DO NOT TAKE ANY DIABETIC MEDICATIONS DAY OF YOUR SURGERY                               You may not have any metal on your body including hair pins and              piercings  Do not wear jewelry, make-up, lotions, powders or perfumes, deodorant                        Men may shave face and neck.   Do not bring valuables to the hospital. Gerald West.  Contacts, dentures or bridgework may not be worn into surgery.  .     Patients discharged the day of surgery will not be allowed to drive home.  Name and phone number of your driver:  Special Instructions: coughing and deep breathing exercises, leg exercises               Please read over the following fact sheets you were given: _____________________________________________________________________             [facility] - Preparing for Surgery Before surgery, you can play an important role.  Because skin is not sterile, your skin needs to be as free of germs as possible.  You can reduce the number of germs on your skin by washing with CHG (chlorahexidine gluconate) soap before surgery.  CHG is an antiseptic cleaner which kills germs and bonds with the skin to continue killing germs even after washing. Please DO NOT use if you have an allergy to CHG or antibacterial soaps.  If your skin becomes reddened/irritated stop using the CHG and inform your nurse when you arrive at Short Stay. Do not shave  (including legs and underarms) for at least 48 hours prior to the first CHG shower.  You may shave your face/neck. Please follow these instructions carefully:  1.  Shower with CHG Soap the night before surgery and the  morning of Surgery.  2.  If you choose to wash your hair, wash your hair first as usual with your  normal  shampoo.  3.  After you shampoo, rinse your hair and body thoroughly to remove the  shampoo.                           4.  Use CHG as you would any other liquid soap.  You can apply chg directly  to the skin and wash  Gently with a scrungie or clean washcloth.  5.  Apply the CHG Soap to your body ONLY FROM THE NECK DOWN.   Do not use on face/ open                           Wound or open sores. Avoid contact with eyes, ears mouth and genitals (private parts).                       Wash face,  Genitals (private parts) with your normal soap.             6.  Wash thoroughly, paying special attention to the area where your surgery  will be performed.  7.  Thoroughly rinse your body with warm water from the neck down.  8.  DO NOT shower/wash with your normal soap after using and rinsing off  the CHG Soap.                9.  Pat yourself dry with a clean towel.            10.  Wear clean pajamas.            11.  Place clean sheets on your bed the night of your first shower and do not  sleep with pets. Day of Surgery : Do not apply any lotions/deodorants the morning of surgery.  Please wear clean clothes to the hospital/surgery center.  FAILURE TO FOLLOW THESE INSTRUCTIONS MAY RESULT IN THE CANCELLATION OF YOUR SURGERY PATIENT SIGNATURE_________________________________  NURSE SIGNATURE__________________________________  ________________________________________________________________________

## 2015-06-26 ENCOUNTER — Encounter (HOSPITAL_COMMUNITY): Payer: Self-pay

## 2015-06-26 ENCOUNTER — Encounter (HOSPITAL_COMMUNITY)
Admission: RE | Admit: 2015-06-26 | Discharge: 2015-06-26 | Disposition: A | Discharge status: Home / Self Care | Payer: BLUE CROSS/BLUE SHIELD | Source: Ambulatory Visit | Attending: General Surgery | Admitting: General Surgery

## 2015-06-26 DIAGNOSIS — Z01818 Encounter for other preprocedural examination: Secondary | ICD-10-CM | POA: Diagnosis present

## 2015-06-26 DIAGNOSIS — K429 Umbilical hernia without obstruction or gangrene: Secondary | ICD-10-CM | POA: Insufficient documentation

## 2015-06-26 LAB — BASIC METABOLIC PANEL
ANION GAP: 7 (ref 5–15)
BUN: 7 mg/dL (ref 6–20)
CALCIUM: 9.2 mg/dL (ref 8.9–10.3)
CO2: 25 mmol/L (ref 22–32)
Chloride: 105 mmol/L (ref 101–111)
Creatinine, Ser: 0.77 mg/dL (ref 0.61–1.24)
GFR calc Af Amer: >60 mL/min (ref >60)
GLUCOSE: 83 mg/dL (ref 65–99)
Potassium: 3.9 mmol/L (ref 3.5–5.1)
Sodium: 137 mmol/L (ref 135–145)

## 2015-06-26 LAB — CBC
HCT: 42.7 % (ref 39.0–52.0)
Hemoglobin: 14.5 g/dL (ref 13.0–17.0)
MCH: 29.7 pg (ref 26.0–34.0)
MCHC: 34 g/dL (ref 30.0–36.0)
MCV: 87.3 fL (ref 78.0–100.0)
PLATELETS: 319 10*3/uL (ref 150–400)
RBC: 4.89 MIL/uL (ref 4.22–5.81)
RDW: 13.8 % (ref 11.5–15.5)
WBC: 11.2 10*3/uL — AB (ref 4.0–10.5)

## 2015-06-26 NOTE — Progress Notes (Signed)
ECHO-06/2014- EPIC

## 2015-06-27 NOTE — Progress Notes (Signed)
Final EKG in EPIC done 06/26/2015.

## 2015-07-03 NOTE — Progress Notes (Signed)
Dr Gifford Shave made aware of positive urine drug screen for opiates on 04/11/15.  No new orders given.

## 2015-07-04 ENCOUNTER — Encounter (HOSPITAL_COMMUNITY)
Admission: RE | Disposition: A | Discharge status: Home / Self Care | Payer: Self-pay | Source: Ambulatory Visit | Attending: General Surgery

## 2015-07-04 ENCOUNTER — Ambulatory Visit (HOSPITAL_COMMUNITY): Payer: BLUE CROSS/BLUE SHIELD | Admitting: Anesthesiology

## 2015-07-04 ENCOUNTER — Ambulatory Visit (HOSPITAL_COMMUNITY)
Admission: RE | Admit: 2015-07-04 | Discharge: 2015-07-04 | Disposition: A | Discharge status: Home / Self Care | Payer: BLUE CROSS/BLUE SHIELD | Source: Ambulatory Visit | Attending: General Surgery | Admitting: General Surgery

## 2015-07-04 ENCOUNTER — Encounter (HOSPITAL_COMMUNITY): Payer: Self-pay | Admitting: *Deleted

## 2015-07-04 DIAGNOSIS — K429 Umbilical hernia without obstruction or gangrene: Secondary | ICD-10-CM | POA: Diagnosis not present

## 2015-07-04 DIAGNOSIS — F17200 Nicotine dependence, unspecified, uncomplicated: Secondary | ICD-10-CM | POA: Insufficient documentation

## 2015-07-04 DIAGNOSIS — I10 Essential (primary) hypertension: Secondary | ICD-10-CM | POA: Insufficient documentation

## 2015-07-04 DIAGNOSIS — Z6833 Body mass index (BMI) 33.0-33.9, adult: Secondary | ICD-10-CM | POA: Diagnosis not present

## 2015-07-04 DIAGNOSIS — E7800 Pure hypercholesterolemia, unspecified: Secondary | ICD-10-CM | POA: Diagnosis not present

## 2015-07-04 DIAGNOSIS — E669 Obesity, unspecified: Secondary | ICD-10-CM | POA: Insufficient documentation

## 2015-07-04 DIAGNOSIS — K4090 Unilateral inguinal hernia, without obstruction or gangrene, not specified as recurrent: Secondary | ICD-10-CM | POA: Insufficient documentation

## 2015-07-04 DIAGNOSIS — K219 Gastro-esophageal reflux disease without esophagitis: Secondary | ICD-10-CM | POA: Insufficient documentation

## 2015-07-04 HISTORY — PX: LAPAROSCOPIC INGUINAL HERNIA WITH UMBILICAL HERNIA: SHX5658

## 2015-07-04 SURGERY — LAPAROSCOPIC INGUINAL HERNIA WITH UMBILICAL HERNIA
Anesthesia: General | Site: Abdomen | Laterality: Left

## 2015-07-04 MED ORDER — ACETAMINOPHEN 650 MG RE SUPP
650.0000 mg | RECTAL | Status: DC | PRN
  Filled 2015-07-04: qty 1

## 2015-07-04 MED ORDER — PROPOFOL 10 MG/ML IV BOLUS
INTRAVENOUS | Status: DC | PRN
  Administered 2015-07-04: 200 mg via INTRAVENOUS

## 2015-07-04 MED ORDER — MORPHINE SULFATE (PF) 10 MG/ML IV SOLN: 2.0000 mg | INTRAVENOUS | Status: DC | PRN

## 2015-07-04 MED ORDER — CHLORHEXIDINE GLUCONATE 4 % EX LIQD: 1.0000 "application " | Freq: Once | CUTANEOUS | Status: DC

## 2015-07-04 MED ORDER — SUGAMMADEX SODIUM 200 MG/2ML IV SOLN
INTRAVENOUS | Status: DC | PRN
  Administered 2015-07-04: 200 mg via INTRAVENOUS

## 2015-07-04 MED ORDER — PROMETHAZINE HCL 25 MG/ML IJ SOLN
6.2500 mg | INTRAMUSCULAR | Status: DC | PRN
Start: 2015-07-04 — End: 2015-07-04
  Administered 2015-07-04: 6.25 mg via INTRAVENOUS
  Filled 2015-07-04: qty 1

## 2015-07-04 MED ORDER — CEFAZOLIN SODIUM-DEXTROSE 2-3 GM-% IV SOLR
INTRAVENOUS | Status: AC
  Filled 2015-07-04: qty 50

## 2015-07-04 MED ORDER — ROCURONIUM BROMIDE 100 MG/10ML IV SOLN
INTRAVENOUS | Status: AC
  Filled 2015-07-04: qty 1

## 2015-07-04 MED ORDER — ROCURONIUM BROMIDE 100 MG/10ML IV SOLN
INTRAVENOUS | Status: DC | PRN
  Administered 2015-07-04: 40 mg via INTRAVENOUS

## 2015-07-04 MED ORDER — CEFAZOLIN SODIUM-DEXTROSE 2-3 GM-% IV SOLR
2.0000 g | INTRAVENOUS | Status: AC
  Administered 2015-07-04: 2 g via INTRAVENOUS

## 2015-07-04 MED ORDER — SODIUM CHLORIDE 0.9 % IV SOLN: 250.0000 mL | INTRAVENOUS | Status: DC | PRN

## 2015-07-04 MED ORDER — SUCCINYLCHOLINE CHLORIDE 20 MG/ML IJ SOLN
INTRAMUSCULAR | Status: DC | PRN
  Administered 2015-07-04: 100 mg via INTRAVENOUS

## 2015-07-04 MED ORDER — ONDANSETRON HCL 4 MG/2ML IJ SOLN
INTRAMUSCULAR | Status: DC | PRN
Start: 2015-07-04 — End: 2015-07-04
  Administered 2015-07-04: 4 mg via INTRAVENOUS

## 2015-07-04 MED ORDER — SUGAMMADEX SODIUM 200 MG/2ML IV SOLN
INTRAVENOUS | Status: AC
  Filled 2015-07-04: qty 2

## 2015-07-04 MED ORDER — ACETAMINOPHEN 10 MG/ML IV SOLN
1000.0000 mg | Freq: Four times a day (QID) | INTRAVENOUS | Status: DC
Start: 2015-07-04 — End: 2015-07-04
  Administered 2015-07-04: 1000 mg via INTRAVENOUS

## 2015-07-04 MED ORDER — HYDROMORPHONE HCL 1 MG/ML IJ SOLN
0.2500 mg | INTRAMUSCULAR | Status: DC | PRN
  Administered 2015-07-04 (×3): 0.5 mg via INTRAVENOUS

## 2015-07-04 MED ORDER — FENTANYL CITRATE (PF) 100 MCG/2ML IJ SOLN
INTRAMUSCULAR | Status: DC | PRN
  Administered 2015-07-04: 100 ug via INTRAVENOUS
  Administered 2015-07-04 (×3): 50 ug via INTRAVENOUS

## 2015-07-04 MED ORDER — FENTANYL CITRATE (PF) 250 MCG/5ML IJ SOLN
INTRAMUSCULAR | Status: AC
  Filled 2015-07-04: qty 25

## 2015-07-04 MED ORDER — HYDROMORPHONE HCL 1 MG/ML IJ SOLN
INTRAMUSCULAR | Status: AC
  Filled 2015-07-04: qty 1

## 2015-07-04 MED ORDER — LIDOCAINE HCL (CARDIAC) 20 MG/ML IV SOLN
INTRAVENOUS | Status: DC | PRN
  Administered 2015-07-04: 100 mg via INTRAVENOUS

## 2015-07-04 MED ORDER — BUPIVACAINE-EPINEPHRINE 0.25% -1:200000 IJ SOLN
INTRAMUSCULAR | Status: DC | PRN
  Administered 2015-07-04: 13 mL

## 2015-07-04 MED ORDER — OXYCODONE-ACETAMINOPHEN 5-325 MG PO TABS: 1.0000 | ORAL_TABLET | ORAL | Status: DC | PRN

## 2015-07-04 MED ORDER — MIDAZOLAM HCL 5 MG/5ML IJ SOLN
INTRAMUSCULAR | Status: DC | PRN
  Administered 2015-07-04: 2 mg via INTRAVENOUS

## 2015-07-04 MED ORDER — LACTATED RINGERS IV SOLN
INTRAVENOUS | Status: DC | PRN
  Administered 2015-07-04 (×2): via INTRAVENOUS

## 2015-07-04 MED ORDER — SODIUM CHLORIDE 0.9 % IJ SOLN: 3.0000 mL | INTRAMUSCULAR | Status: DC | PRN

## 2015-07-04 MED ORDER — DEXAMETHASONE SODIUM PHOSPHATE 10 MG/ML IJ SOLN
INTRAMUSCULAR | Status: DC | PRN
  Administered 2015-07-04: 10 mg via INTRAVENOUS

## 2015-07-04 MED ORDER — ACETAMINOPHEN 325 MG PO TABS: 650.0000 mg | ORAL_TABLET | ORAL | Status: DC | PRN

## 2015-07-04 MED ORDER — PHENYLEPHRINE 40 MCG/ML (10ML) SYRINGE FOR IV PUSH (FOR BLOOD PRESSURE SUPPORT)
PREFILLED_SYRINGE | INTRAVENOUS | Status: AC
  Filled 2015-07-04: qty 10

## 2015-07-04 MED ORDER — PROPOFOL 10 MG/ML IV BOLUS
INTRAVENOUS | Status: AC
  Filled 2015-07-04: qty 20

## 2015-07-04 MED ORDER — MIDAZOLAM HCL 2 MG/2ML IJ SOLN
INTRAMUSCULAR | Status: AC
  Filled 2015-07-04: qty 4

## 2015-07-04 MED ORDER — SODIUM CHLORIDE 0.9 % IJ SOLN: 3.0000 mL | Freq: Two times a day (BID) | INTRAMUSCULAR | Status: DC

## 2015-07-04 MED ORDER — OXYCODONE HCL 5 MG PO TABS
5.0000 mg | ORAL_TABLET | ORAL | Status: DC | PRN
  Administered 2015-07-04: 5 mg via ORAL
  Filled 2015-07-04: qty 2

## 2015-07-04 MED ORDER — ACETAMINOPHEN 10 MG/ML IV SOLN
INTRAVENOUS | Status: AC
  Filled 2015-07-04: qty 100

## 2015-07-04 MED ORDER — DEXAMETHASONE SODIUM PHOSPHATE 10 MG/ML IJ SOLN
INTRAMUSCULAR | Status: AC
  Filled 2015-07-04: qty 1

## 2015-07-04 MED ORDER — PHENYLEPHRINE HCL 10 MG/ML IJ SOLN
INTRAMUSCULAR | Status: DC | PRN
  Administered 2015-07-04: 80 ug via INTRAVENOUS

## 2015-07-04 MED ORDER — BUPIVACAINE-EPINEPHRINE (PF) 0.25% -1:200000 IJ SOLN
INTRAMUSCULAR | Status: AC
  Filled 2015-07-04: qty 30

## 2015-07-04 MED ORDER — ONDANSETRON HCL 4 MG/2ML IJ SOLN
INTRAMUSCULAR | Status: AC
  Filled 2015-07-04: qty 2

## 2015-07-04 MED ORDER — LIDOCAINE HCL (CARDIAC) 20 MG/ML IV SOLN
INTRAVENOUS | Status: AC
  Filled 2015-07-04: qty 5

## 2015-07-04 SURGICAL SUPPLY — 40 items
APL SKNCLS STERI-STRIP NONHPOA (GAUZE/BANDAGES/DRESSINGS) ×1
BENZOIN TINCTURE PRP APPL 2/3 (GAUZE/BANDAGES/DRESSINGS) ×3 IMPLANT
CABLE HIGH FREQUENCY MONO STRZ (ELECTRODE) ×3 IMPLANT
CLOSURE WOUND 1/2 X4 (GAUZE/BANDAGES/DRESSINGS) ×1
COVER SURGICAL LIGHT HANDLE (MISCELLANEOUS) ×3 IMPLANT
DECANTER SPIKE VIAL GLASS SM (MISCELLANEOUS) ×3 IMPLANT
DEVICE SECURE STRAP 25 ABSORB (INSTRUMENTS) ×4 IMPLANT
DRAPE LAPAROSCOPIC ABDOMINAL (DRAPES) ×1 IMPLANT
DRSG TEGADERM 2-3/8X2-3/4 SM (GAUZE/BANDAGES/DRESSINGS) IMPLANT
ELECT REM PT RETURN 9FT ADLT (ELECTROSURGICAL) ×3
ELECTRODE REM PT RTRN 9FT ADLT (ELECTROSURGICAL) ×1 IMPLANT
GAUZE SPONGE 2X2 8PLY STRL LF (GAUZE/BANDAGES/DRESSINGS) IMPLANT
GLOVE BIO SURGEON STRL SZ7.5 (GLOVE) ×3 IMPLANT
GOWN STRL REUS W/TWL XL LVL3 (GOWN DISPOSABLE) ×6 IMPLANT
KIT BASIN OR (CUSTOM PROCEDURE TRAY) ×3 IMPLANT
MESH 3DMAX 4X6 LT LRG (Mesh General) ×2 IMPLANT
MESH VENTRALIGHT ST 4.5IN (Mesh General) ×2 IMPLANT
NDL INSUFFLATION 14GA 120MM (NEEDLE) IMPLANT
NEEDLE INSUFFLATION 14GA 120MM (NEEDLE) ×3 IMPLANT
NS IRRIG 1000ML POUR BTL (IV SOLUTION) ×3 IMPLANT
RELOAD STAPLE 4.0 BLU F/HERNIA (INSTRUMENTS) ×1 IMPLANT
RELOAD STAPLE HERNIA 4.0 BLUE (INSTRUMENTS) ×3 IMPLANT
SCISSORS LAP 5X35 DISP (ENDOMECHANICALS) ×2 IMPLANT
SET IRRIG TUBING LAPAROSCOPIC (IRRIGATION / IRRIGATOR) IMPLANT
SOLUTION ANTI FOG 6CC (MISCELLANEOUS) ×3 IMPLANT
SPONGE GAUZE 2X2 STER 10/PKG (GAUZE/BANDAGES/DRESSINGS) ×2
STAPLER HERNIA 12 8.5 360D (INSTRUMENTS) ×3 IMPLANT
STRIP CLOSURE SKIN 1/2X4 (GAUZE/BANDAGES/DRESSINGS) ×2 IMPLANT
SUT CHROMIC 2 0 SH (SUTURE) ×2 IMPLANT
SUT MNCRL AB 4-0 PS2 18 (SUTURE) IMPLANT
SUT VICRYL 0 UR6 27IN ABS (SUTURE) ×4 IMPLANT
TAPE CLOTH SURG 4X10 WHT LF (GAUZE/BANDAGES/DRESSINGS) ×2 IMPLANT
TOWEL OR 17X26 10 PK STRL BLUE (TOWEL DISPOSABLE) ×3 IMPLANT
TOWEL OR NON WOVEN STRL DISP B (DISPOSABLE) ×3 IMPLANT
TRAY FOLEY W/METER SILVER 14FR (SET/KITS/TRAYS/PACK) ×1 IMPLANT
TRAY FOLEY W/METER SILVER 16FR (SET/KITS/TRAYS/PACK) ×3 IMPLANT
TRAY LAPAROSCOPIC (CUSTOM PROCEDURE TRAY) ×3 IMPLANT
TROCAR CANNULA W/PORT DUAL 5MM (MISCELLANEOUS) ×3 IMPLANT
TROCAR XCEL 12X100 BLDLESS (ENDOMECHANICALS) ×3 IMPLANT
TUBING INSUFFLATION 10FT LAP (TUBING) ×3 IMPLANT

## 2015-07-04 NOTE — Op Note (Signed)
07/04/2015  10:31 AM  PATIENT:  Gerald West  49 y.o. male  PRE-OPERATIVE DIAGNOSIS:  UMBILICAL HERNIA AND POSSIBLE LEFT INGUINAL HERNIA  POST-OPERATIVE DIAGNOSIS:  UMBILICAL HERNIA AND LEFT INGUINAL HERNIA  PROCEDURE:  Procedure(s): LAPAROSCOPIC UMBILICAL HERNIA REPAIR WITH MESH AND LEFT INGUINAL HERNIA REPAIR WITH MESH (Left)  SURGEON:  Surgeon(s) and Role:    * Ralene Ok, MD - Primary  ANESTHESIA:   local and general  EBL:5cc   Total I/O In: -  Out: 300 [Urine:300]  BLOOD ADMINISTERED:none  DRAINS: none   LOCAL MEDICATIONS USED:  BUPIVICAINE   SPECIMEN:  No Specimen  DISPOSITION OF SPECIMEN:  N/A  COUNTS:  YES  TOURNIQUET:  * No tourniquets in log *  DICTATION: .Dragon Dictation  Details of the procedure:   After the patient was consented patient was taken back to the operating room patient was then placed in supine position bilateral SCDs in place.  The patient was prepped and draped in the usual sterile fashion. After antibiotics were confirmed a timeout was called and all facts were verified. The Veress needle technique was used to insuflate the abdomen at Palmer's point. The abdomen was insufflated to 14 mm mercury. Subsequently a 5 mm trocar was placed a camera inserted there was no injury to any intra-abdominal organs.    There was seen to be an umbilical hernia.  A second camera port was in placed into the left lower quadrant.   At this the Falicform ligament was taken down with Bovie cautery maintaining hemostasis.   I proceeded to reduce the hernia contents.  Once the hernia was cleared away, a Bard Ventralight 11.5cm  mesh was inserted into the abdomen.  The mesh was secured circumferentially with am Securestrap tacker in a double crown fashion.   There did appear to be a small inguinal hernia on the left side seen.  We proceeded with Lap inguinal hernia repair.  0.25% Marcaine was used to infiltrate the umbilical area. A 11-blade was used to cut  down the skin and blunt dissection was used to get the anterior fashion.  The anterior fascia was incised approximately 1 cm and the muscles were retracted laterally. Blunt dissection was then used to create a space in the preperitoneal area. At this time a 10 mm camera was then introduced into the space and advanced the pubic tubercle and a 12 mm trocar was placed over this and insufflation was started.  At this time and space was created from medial to laterally the preperitoneal space.  Cooper's ligament was initially cleaned off.  The hernia sac was identified in the left indirect space. Dissection of the hernia sac was undertaken the vas deferens was identified and protected in all parts of the case.   There was a large cord lipoma also dissected back into the preperitoneal space.  Once the hernia sac was taken down to approximately the umbilicus a Bard 3D Max mesh, size: Large, was  introduced into the preperitoneal space.  The mesh was brought over to cover the direct and indirect hernia spaces.  This was anchored into place and secured to Cooper's ligament with 4.47mm staples from a Coviden hernia stapler. It was anchored to the anterior abdominal wall with 4.8 mm staples. The hernia sac was seen lying posterior to the mesh. There was no staples placed laterally. The insufflation was evacuated and the peritoneum was seen posterior to the mesh. The trochars were removed. The anterior fascia was reapproximated using #1 Vicryl on a  UR- 6.  Intra-abdominal air was evacuated and the Veress needle removed. The skin was reapproximated using 4-0 Monocryl subcuticular fashion.   The omentum was brought over the area of the mesh. The pneumoperitoneum was evacuated  & all trocars  were removed. The skin was reapproximated with 4-0  Monocryl sutures in a subcuticular fashion. The skin was dressed with Steri-Strips tape and gauze.  The patient was taken to the recovery room in stable condition.   PLAN OF CARE:  Discharge to home after PACU  PATIENT DISPOSITION:  PACU - hemodynamically stable.   Delay start of Pharmacological VTE agent (>24hrs) due to surgical blood loss or risk of bleeding: not applicable

## 2015-07-04 NOTE — Anesthesia Procedure Notes (Signed)
Procedure Name: Intubation Date/Time: 07/04/2015 9:25 AM Performed by: Lind Covert Pre-anesthesia Checklist: Patient identified, Emergency Drugs available, Suction available, Patient being monitored and Timeout performed Patient Re-evaluated:Patient Re-evaluated prior to inductionOxygen Delivery Method: Circle system utilized Preoxygenation: Pre-oxygenation with 100% oxygen Intubation Type: IV induction Laryngoscope Size: Mac and 4 Grade View: Grade I Tube type: Oral Tube size: 7.5 mm Number of attempts: 1 Airway Equipment and Method: Stylet Placement Confirmation: ETT inserted through vocal cords under direct vision,  positive ETCO2 and breath sounds checked- equal and bilateral Secured at: 21 cm Tube secured with: Tape Dental Injury: Teeth and Oropharynx as per pre-operative assessment

## 2015-07-04 NOTE — Transfer of Care (Signed)
Immediate Anesthesia Transfer of Care Note  Patient: Gerald West  Procedure(s) Performed: Procedure(s): LAPAROSCOPIC UMBILICAL HERNIA REPAIR WITH MESH AND LEFT INGUINAL HERNIA REPAIR WITH MESH (Left)  Patient Location: PACU  Anesthesia Type:General  Level of Consciousness: sedated  Airway & Oxygen Therapy: Patient Spontanous Breathing and Patient connected to face mask oxygen  Post-op Assessment: Report given to RN and Post -op Vital signs reviewed and stable  Post vital signs: Reviewed and stable  Last Vitals:  Filed Vitals:   07/04/15 0720  BP: 136/89  Pulse: 101  Temp: 36.6 C  Resp: 18    Complications: No apparent anesthesia complications

## 2015-07-04 NOTE — Discharge Instructions (Signed)
.[facility] _______[facility]  UMBILICAL & INGUINAL HERNIA REPAIR: POST OP INSTRUCTIONS  Always review your discharge instruction sheet given to you by the facility where your surgery was performed. IF YOU HAVE DISABILITY OR FAMILY LEAVE FORMS, YOU MUST BRING THEM TO THE OFFICE FOR PROCESSING.   DO NOT GIVE THEM TO YOUR DOCTOR.  1. A  prescription for pain medication may be given to you upon discharge.  Take your pain medication as prescribed, if needed.  If narcotic pain medicine is not needed, then you may take acetaminophen (Tylenol) or ibuprofen (Advil) as needed. 2. Take your usually prescribed medications unless otherwise directed. 3. If you need a refill on your pain medication, please contact your pharmacy.  They will contact our office to request authorization. Prescriptions will not be filled after 5 pm or on week-ends. 4. You should follow a light diet the first 24 hours after arrival home, such as soup and crackers, etc.  Be sure to include lots of fluids daily.  Resume your normal diet the day after surgery. 5. Most patients will experience some swelling and bruising around the umbilicus or in the groin and scrotum.  Ice packs and reclining will help.  Swelling and bruising can take several days to resolve.  6. It is common to experience some constipation if taking pain medication after surgery.  Increasing fluid intake and taking a stool softener (such as Colace) will usually help or prevent this problem from occurring.  A mild laxative (Milk of Magnesia or Miralax) should be taken according to package directions if there are no bowel movements after 48 hours. 7. Unless discharge instructions indicate otherwise, you may remove your bandages 24-48 hours after surgery, and you may shower at that time.  You may have steri-strips (small skin tapes) in place directly over the incision.  These strips should be left on the skin for 7-10 days.  If your surgeon used skin glue on the  incision, you may shower in 24 hours.  The glue will flake off over the next 2-3 weeks.  Any sutures or staples will be removed at the office during your follow-up visit. 8. ACTIVITIES:  You may resume regular (light) daily activities beginning the next day--such as daily self-care, walking, climbing stairs--gradually increasing activities as tolerated.  You may have sexual intercourse when it is comfortable.  Refrain from any heavy lifting or straining until approved by your doctor. a. You may drive when you are no longer taking prescription pain medication, you can comfortably wear a seatbelt, and you can safely maneuver your car and apply brakes. b. RETURN TO WORK:  __________________________________________________________ 9. You should see your doctor in the office for a follow-up appointment approximately 2-3 weeks after your surgery.  Make sure that you call for this appointment within a day or two after you arrive home to insure a convenient appointment time. 10. OTHER INSTRUCTIONS:  __________________________________________________________________________________________________________________________________________________________________________________________  WHEN TO CALL YOUR DOCTOR: 1. Fever over 101.0 2. Inability to urinate 3. Nausea and/or vomiting 4. Extreme swelling or bruising 5. Continued bleeding from incision. 6. Increased pain, redness, or drainage from the incision  The clinic staff is available to answer your questions during regular business hours.  Please dont hesitate to call and ask to speak to one of the nurses for clinical concerns.  If you have a medical emergency, go to the nearest emergency room or call 911.  A surgeon from [facility] is always on call at the hospital  [address], [address], [place], [place]  [address] ? ° [address] [address], [place], [place]   [address] °[phone] ? [phone] ? FAX [phone] °Web site:  [email or web address] ° °General Anesthesia, Adult, Care After °Refer to this sheet in the next few weeks. These instructions provide you with information on caring for yourself after your procedure. Your health care provider may also give you more specific instructions. Your treatment has been planned according to current medical practices, but problems sometimes occur. Call your health care provider if you have any problems or questions after your procedure. °WHAT TO EXPECT AFTER THE PROCEDURE °After the procedure, it is typical to experience: °· Sleepiness. °· Nausea and vomiting. °HOME CARE INSTRUCTIONS °· For the first 24 hours after general anesthesia: °¨ Have a responsible person with you. °¨ Do not drive a car. If you are alone, do not take public transportation. °¨ Do not drink alcohol. °¨ Do not take medicine that has not been prescribed by your health care provider. °¨ Do not sign important papers or make important decisions. °¨ You may resume a normal diet and activities as directed by your health care provider. °· Change bandages (dressings) as directed. °· If you have questions or problems that seem related to general anesthesia, call the hospital and ask for the anesthetist or anesthesiologist on call. °SEEK MEDICAL CARE IF: °· You have nausea and vomiting that continue the day after anesthesia. °· You develop a rash. °SEEK IMMEDIATE MEDICAL CARE IF:  °· You have difficulty breathing. °· You have chest pain. °· You have any allergic problems. °  °This information is not intended to replace advice given to you by your health care provider. Make sure you discuss any questions you have with your health care provider. °  °Document Released: 11/15/2000 Document Revised: 08/30/2014 Document Reviewed: 12/08/2011 °Elsevier Interactive Patient Education ©2016 [employer] ° °

## 2015-07-04 NOTE — Interval H&P Note (Signed)
History and Physical Interval Note:  07/04/2015 7:33 AM  Gerald West  has presented today for surgery, with the diagnosis of UMBILICAL HERNIA AND POSSIBLE LEFT INGUINAL HERNIA  The various methods of treatment have been discussed with the patient and family. After consideration of risks, benefits and other options for treatment, the patient has consented to  Procedure(s): [place] LEFT INGUINAL HERNIA REPAIR WITH MESH (N/A) as a surgical intervention .  The patient's history has been reviewed, patient examined, no change in status, stable for surgery.  I have reviewed the patient's chart and labs.  Questions were answered to the patient's satisfaction.     Rosario Jacks., Anne Hahn

## 2015-07-04 NOTE — Anesthesia Preprocedure Evaluation (Signed)
Anesthesia Evaluation  Patient identified by MRN, date of birth, ID band Patient awake    Reviewed: Allergy & Precautions, NPO status , Patient's Chart, lab work & pertinent test results  Airway Mallampati: II  TM Distance: >3 FB Neck ROM: Full    Dental no notable dental hx.    Pulmonary Current Smoker,    Pulmonary exam normal breath sounds clear to auscultation       Cardiovascular hypertension, Pt. on medications Normal cardiovascular exam Rhythm:Regular Rate:Normal     Neuro/Psych PSYCHIATRIC DISORDERS Depression negative neurological ROS     GI/Hepatic Neg liver ROS, GERD  Medicated,  Endo/Other  negative endocrine ROS  Renal/GU negative Renal ROS  negative genitourinary   Musculoskeletal negative musculoskeletal ROS (+)   Abdominal (+) + obese,   Peds negative pediatric ROS (+)  Hematology negative hematology ROS (+)   Anesthesia Other Findings   Reproductive/Obstetrics negative OB ROS                             Anesthesia Physical Anesthesia Plan  ASA: II  Anesthesia Plan: General   Post-op Pain Management:    Induction: Intravenous  Airway Management Planned: Oral ETT  Additional Equipment:   Intra-op Plan:   Post-operative Plan: Extubation in OR  Informed Consent: I have reviewed the patients History and Physical, chart, labs and discussed the procedure including the risks, benefits and alternatives for the proposed anesthesia with the patient or authorized representative who has indicated his/her understanding and acceptance.   Dental advisory given  Plan Discussed with: CRNA  Anesthesia Plan Comments:         Anesthesia Quick Evaluation

## 2015-07-04 NOTE — OR Nursing (Signed)
Post note from PACU RN Ned Card 07/04/15.   Pt was verbally abusive and argumentative through out his recovery period.  He would not take instructions that would help his pain. He would do impulsive things that would drive his pain up. He felt that no one was helping him. I was attentive to his pain needs and tried to coach him.  He wouldn't take beep breaths or cough. He remained angry and upset all the way to Short Stay.

## 2015-07-04 NOTE — H&P (View-Only) (Signed)
History of Present Illness (Gerald Ramos MD; 05/20/2015 2:27 PM) The patient is a 49 year old male who presents with an umbilical hernia. Patient is a 49-year-old male who is referred by catheter Matheny for evaluation of an umbilical hernia. Patient states that this is been here for about 2 years. He states his become more tender. The patient states that it's more cumbersome when playing golf. Patient also has a area of tenderness the left groin crease. He states it radiates down to the left testicle. He has not noticed any bulge the left side.   Other Problems (Gerald West, CMA; 05/20/2015 2:11 PM) Back Pain Depression Gastroesophageal Reflux Disease Hemorrhoids High blood pressure Hypercholesterolemia  Diagnostic Studies History (Gerald West, CMA; 05/20/2015 2:11 PM) Colonoscopy 1-5 years ago  Allergies (Gerald West, CMA; 05/20/2015 2:12 PM) ACE Bandage Self-Adhering *MEDICAL DEVICES AND SUPPLIES*  Medication History (Gerald West, CMA; 05/20/2015 2:13 PM) Losartan Potassium-HCTZ (100-25MG Tablet, Oral) Active. Potassium (99MG Tablet, Oral) Active. Medications Reconciled  Social History (Gerald West, CMA; 05/20/2015 2:11 PM) Alcohol use Moderate alcohol use. Caffeine use Carbonated beverages, Coffee, Tea. Tobacco use Current every day smoker.    Review of Systems (Gerald West CMA; 05/20/2015 2:11 PM) Gastrointestinal Present- Abdominal Pain. Not Present- Bloating, Bloody Stool, Change in Bowel Habits, Chronic diarrhea, Constipation, Difficulty Swallowing, Excessive gas, Gets full quickly at meals, Hemorrhoids, Indigestion, Nausea, Rectal Pain and Vomiting.  Vitals (Gerald West CMA; 05/20/2015 2:13 PM) 05/20/2015 2:13 PM Weight: 201 lb Height: 65in Body Surface Area: 2.04 m Body Mass Index: 33.45 kg/m  Temp.: 97.3F(Temporal)  Pulse: 90 (Regular)  BP: 130/70 (Sitting, Left Arm, Standard)     Physical Exam (Gerald Malphrus MD; 05/20/2015 2:27  PM) General Mental Status-Alert. General Appearance-Consistent with stated age. Hydration-Well hydrated. Voice-Normal.  Head and Neck Head-normocephalic, atraumatic with no lesions or palpable masses. Trachea-midline. Thyroid Gland Characteristics - normal size and consistency.  Chest and Lung Exam Chest and lung exam reveals -quiet, even and easy respiratory effort with no use of accessory muscles and on auscultation, normal breath sounds, no adventitious sounds and normal vocal resonance. Inspection Chest Wall - Normal. Back - normal.  Cardiovascular Cardiovascular examination reveals -normal heart sounds, regular rate and rhythm with no murmurs and normal pedal pulses bilaterally.  Abdomen Inspection Skin - Scar - no surgical scars. Hernias - Umbilical hernia - Reducible(Small approximate 1 cm tender umbilical hernia). Inguinal hernia - Left - Note: No palpable hernia on Valsalva. Palpation/Percussion Normal exam - Soft, Non Tender, No Rebound tenderness, No Rigidity (guarding) and No hepatosplenomegaly. Auscultation Normal exam - Bowel sounds normal.    Assessment & Plan ([name] MD; 05/20/2015 2:30 PM) UMBILICAL HERNIA WITHOUT OBSTRUCTION AND WITHOUT GANGRENE (K42.9) Impression: 49-year-old male with an umbilical hernia approximate 1 cm.  The patient will like to proceed to the operative for a laparoscopic umbilical hernia repair with mesh. Also discussed the patient we will proceed to explore the left side intraperitoneally if there is any hernia to the left inguinal area we'll proceed with T APP repair. All risks and benefits were discussed with the patient to generally include, but not limited to: infection, bleeding, damage to surrounding structures, acute and chronic nerve pain, and recurrence. Alternatives were offered and described. All questions were answered and the patient voiced understanding of the procedure and wishes to proceed at this  point with hernia repair.  LEFT INGUINAL HERNIA (K40.90) Impression: 49-year-old male with a left inguinal crease pain.  I discussed with him well fixed and his   laparoscopic scopic umbilical hernia repair we will explore the left side intra-abdominally. Additionally hernia in place we'll fix it with mesh. 

## 2015-07-04 NOTE — Anesthesia Postprocedure Evaluation (Signed)
  Anesthesia Post-op Note  Patient: Gerald West  Procedure(s) Performed: Procedure(s) (LRB): LAPAROSCOPIC UMBILICAL HERNIA REPAIR WITH MESH AND LEFT INGUINAL HERNIA REPAIR WITH MESH (Left)  Patient Location: PACU  Anesthesia Type: general.  Level of Consciousness: awake and alert   Airway and Oxygen Therapy: Patient Spontanous Breathing  Post-op Pain: mild  Post-op Assessment: Post-op Vital signs reviewed, Patient's Cardiovascular Status Stable, Respiratory Function Stable, Patent Airway and No signs of Nausea or vomiting  Last Vitals:  Filed Vitals:   07/04/15 1148  BP: 138/84  Pulse:   Temp: 36.6 C  Resp: 20    Post-op Vital Signs: stable   Complications: No apparent anesthesia complications

## 2015-07-08 ENCOUNTER — Telehealth: Payer: Self-pay | Admitting: Family Medicine

## 2015-07-08 ENCOUNTER — Other Ambulatory Visit: Payer: Self-pay

## 2015-07-08 MED ORDER — OXYCODONE-ACETAMINOPHEN 5-325 MG PO TABS: 1.0000 | ORAL_TABLET | Freq: Two times a day (BID) | ORAL | Status: DC | PRN

## 2015-07-08 NOTE — Telephone Encounter (Signed)
Ok to fill for one month, dated

## 2015-07-08 NOTE — Telephone Encounter (Signed)
Pt just had Hernia surgery and he is due to pick up his Prescription on Friday but since his surgery , he can not drive and a family member is able to pick up his script for him tomorrow Wednesday 07/09/15. He says its okay to date it for 07/11/15 since that's when its due. Please call patient and let him know its okay

## 2015-07-08 NOTE — Telephone Encounter (Signed)
Printed and placed in Dr [employer].

## 2015-07-11 ENCOUNTER — Telehealth: Payer: Self-pay | Admitting: Emergency Medicine

## 2015-07-11 ENCOUNTER — Telehealth: Payer: Self-pay

## 2015-07-11 NOTE — Telephone Encounter (Signed)
error 

## 2015-07-25 ENCOUNTER — Other Ambulatory Visit: Payer: Self-pay | Admitting: Family Medicine

## 2015-07-25 NOTE — Telephone Encounter (Signed)
Please advise 

## 2015-08-07 ENCOUNTER — Telehealth: Payer: Self-pay | Admitting: Family Medicine

## 2015-08-07 MED ORDER — OXYCODONE-ACETAMINOPHEN 5-325 MG PO TABS: 1.0000 | ORAL_TABLET | ORAL | Status: DC | PRN

## 2015-08-07 NOTE — Telephone Encounter (Signed)
OK to print and date

## 2015-08-07 NOTE — Telephone Encounter (Signed)
Pt called and wants to pick up his pain-med Friday because he is due Sunday. Will you please leave at front desk for patient to pick up Friday and call and let him know. Thanks

## 2015-08-11 ENCOUNTER — Other Ambulatory Visit: Payer: Self-pay

## 2015-08-11 MED ORDER — OXYCODONE-ACETAMINOPHEN 5-325 MG PO TABS: 1.0000 | ORAL_TABLET | ORAL | Status: AC | PRN

## 2015-08-11 NOTE — Telephone Encounter (Signed)
Rx printed for oxycodone 5/325 had incorrect quantity.  New rx printed with correct quantity and given to patients wife.

## 2015-09-09 ENCOUNTER — Telehealth: Payer: Self-pay | Admitting: Family Medicine

## 2015-09-09 ENCOUNTER — Encounter: Payer: Self-pay | Admitting: Family Medicine

## 2015-09-09 NOTE — Telephone Encounter (Signed)
Patient is due for his script tomorrow but states he would like to pick it up around 3 this afternoon because he will be in [place]. Please call patient and let me know IF this is okay?

## 2015-09-09 NOTE — Telephone Encounter (Signed)
He needs an appt.  Do not schedule for today.

## 2015-09-11 ENCOUNTER — Telehealth: Payer: Self-pay | Admitting: Family Medicine

## 2015-09-11 ENCOUNTER — Ambulatory Visit: Payer: BLUE CROSS/BLUE SHIELD | Admitting: Family Medicine

## 2015-09-11 DIAGNOSIS — R52 Pain, unspecified: Secondary | ICD-10-CM

## 2015-09-11 DIAGNOSIS — G8929 Other chronic pain: Secondary | ICD-10-CM

## 2015-09-11 DIAGNOSIS — M549 Dorsalgia, unspecified: Secondary | ICD-10-CM

## 2015-09-11 NOTE — Telephone Encounter (Signed)
Gerald West was scheduled for an office visit today and Dr. Madilyn Fireman dicharged him from the practice due to an outburst that occurred in our lobby on 09/09/15. I spoke with [name] and he would like a referral to a Pain Clinic.

## 2015-09-12 NOTE — Telephone Encounter (Signed)
Referral placed.

## 2015-12-11 ENCOUNTER — Other Ambulatory Visit: Payer: Self-pay | Admitting: Family Medicine

## 2016-04-05 ENCOUNTER — Other Ambulatory Visit: Payer: Self-pay | Admitting: Family Medicine

## 2017-11-21 ENCOUNTER — Encounter: Payer: Self-pay | Admitting: Gastroenterology
